# Patient Record
Sex: Male | Born: 1970 | Race: Black or African American | Hispanic: No | Smoking: Never smoker
Health system: Southern US, Community
[De-identification: ages and names within clinical notes are randomized; demographics above are authoritative.]

## PROBLEM LIST (undated history)

## (undated) DIAGNOSIS — N189 Chronic kidney disease, unspecified: Secondary | ICD-10-CM

## (undated) DIAGNOSIS — I509 Heart failure, unspecified: Secondary | ICD-10-CM

## (undated) DIAGNOSIS — I10 Essential (primary) hypertension: Secondary | ICD-10-CM

## (undated) DIAGNOSIS — E785 Hyperlipidemia, unspecified: Secondary | ICD-10-CM

## (undated) HISTORY — PX: AMPUTATION: SHX166

---

## 1998-09-29 HISTORY — PX: OTHER SURGICAL HISTORY: SHX169

## 2010-06-22 ENCOUNTER — Ambulatory Visit: Payer: Self-pay | Admitting: Cardiology

## 2010-06-22 ENCOUNTER — Inpatient Hospital Stay (HOSPITAL_COMMUNITY): Admission: EM | Admit: 2010-06-22 | Discharge: 2010-06-24 | Payer: Self-pay | Admitting: Emergency Medicine

## 2010-06-24 ENCOUNTER — Encounter (INDEPENDENT_AMBULATORY_CARE_PROVIDER_SITE_OTHER): Payer: Self-pay | Admitting: Internal Medicine

## 2010-12-12 LAB — CBC
HCT: 42.1 % (ref 39.0–52.0)
Hemoglobin: 12.6 g/dL — ABNORMAL LOW (ref 13.0–17.0)
MCH: 27.8 pg (ref 26.0–34.0)
MCHC: 32.8 g/dL (ref 30.0–36.0)
MCHC: 33.5 g/dL (ref 30.0–36.0)
MCV: 82.9 fL (ref 78.0–100.0)
MCV: 84.3 fL (ref 78.0–100.0)
Platelets: 302 10*3/uL (ref 150–400)
Platelets: 344 10*3/uL (ref 150–400)
RBC: 4.55 MIL/uL (ref 4.22–5.81)
RDW: 14.4 % (ref 11.5–15.5)
WBC: 8.2 10*3/uL (ref 4.0–10.5)

## 2010-12-12 LAB — HEPATITIS PANEL, ACUTE
Hep B C IgM: NEGATIVE
Hepatitis B Surface Ag: NEGATIVE

## 2010-12-12 LAB — COMPREHENSIVE METABOLIC PANEL
AST: 39 U/L — ABNORMAL HIGH (ref 0–37)
BUN: 21 mg/dL (ref 6–23)
CO2: 25 mEq/L (ref 19–32)
Calcium: 9 mg/dL (ref 8.4–10.5)
Chloride: 105 mEq/L (ref 96–112)
Creatinine, Ser: 1.17 mg/dL (ref 0.4–1.5)
GFR calc Af Amer: >60 mL/min (ref >60)
GFR calc non Af Amer: >60 mL/min (ref >60)
Total Bilirubin: 0.4 mg/dL (ref 0.3–1.2)

## 2010-12-12 LAB — DIFFERENTIAL
Basophils Absolute: 0 10*3/uL (ref 0.0–0.1)
Basophils Absolute: 0.1 10*3/uL (ref 0.0–0.1)
Basophils Relative: 1 % (ref 0–1)
Eosinophils Relative: 7 % — ABNORMAL HIGH (ref 0–5)
Lymphocytes Relative: 34 % (ref 12–46)
Lymphs Abs: 2.1 10*3/uL (ref 0.7–4.0)
Monocytes Absolute: 0.6 10*3/uL (ref 0.1–1.0)
Neutro Abs: 3.1 10*3/uL (ref 1.7–7.7)
Neutrophils Relative %: 51 % (ref 43–77)

## 2010-12-12 LAB — GLUCOSE, CAPILLARY
Glucose-Capillary: 126 mg/dL — ABNORMAL HIGH (ref 70–99)
Glucose-Capillary: 142 mg/dL — ABNORMAL HIGH (ref 70–99)
Glucose-Capillary: 179 mg/dL — ABNORMAL HIGH (ref 70–99)
Glucose-Capillary: 250 mg/dL — ABNORMAL HIGH (ref 70–99)
Glucose-Capillary: 250 mg/dL — ABNORMAL HIGH (ref 70–99)
Glucose-Capillary: 253 mg/dL — ABNORMAL HIGH (ref 70–99)
Glucose-Capillary: 299 mg/dL — ABNORMAL HIGH (ref 70–99)
Glucose-Capillary: 316 mg/dL — ABNORMAL HIGH (ref 70–99)
Glucose-Capillary: 327 mg/dL — ABNORMAL HIGH (ref 70–99)
Glucose-Capillary: 347 mg/dL — ABNORMAL HIGH (ref 70–99)
Glucose-Capillary: 351 mg/dL — ABNORMAL HIGH (ref 70–99)
Glucose-Capillary: >600 mg/dL (ref 70–99)

## 2010-12-12 LAB — BASIC METABOLIC PANEL
BUN: 20 mg/dL (ref 6–23)
BUN: 21 mg/dL (ref 6–23)
BUN: 22 mg/dL (ref 6–23)
BUN: 23 mg/dL (ref 6–23)
CO2: 21 mEq/L (ref 19–32)
CO2: 24 mEq/L (ref 19–32)
CO2: 25 mEq/L (ref 19–32)
CO2: 25 mEq/L (ref 19–32)
Calcium: 8.6 mg/dL (ref 8.4–10.5)
Calcium: 8.9 mg/dL (ref 8.4–10.5)
Calcium: 9 mg/dL (ref 8.4–10.5)
Chloride: 103 mEq/L (ref 96–112)
Chloride: 105 mEq/L (ref 96–112)
Creatinine, Ser: 1.11 mg/dL (ref 0.4–1.5)
Creatinine, Ser: 1.16 mg/dL (ref 0.4–1.5)
Creatinine, Ser: 1.25 mg/dL (ref 0.4–1.5)
Creatinine, Ser: 1.34 mg/dL (ref 0.4–1.5)
GFR calc Af Amer: >60 mL/min (ref >60)
GFR calc Af Amer: >60 mL/min (ref >60)
GFR calc Af Amer: >60 mL/min (ref >60)
GFR calc non Af Amer: 60 mL/min — ABNORMAL LOW (ref >60)
GFR calc non Af Amer: >60 mL/min (ref >60)
Glucose, Bld: 260 mg/dL — ABNORMAL HIGH (ref 70–99)
Glucose, Bld: 395 mg/dL — ABNORMAL HIGH (ref 70–99)
Potassium: 4.3 mEq/L (ref 3.5–5.1)
Potassium: 5.1 mEq/L (ref 3.5–5.1)
Sodium: 123 mEq/L — ABNORMAL LOW (ref 135–145)
Sodium: 130 mEq/L — ABNORMAL LOW (ref 135–145)
Sodium: 133 mEq/L — ABNORMAL LOW (ref 135–145)
Sodium: 135 mEq/L (ref 135–145)

## 2010-12-12 LAB — URINALYSIS, ROUTINE W REFLEX MICROSCOPIC
Bilirubin Urine: NEGATIVE
Ketones, ur: NEGATIVE mg/dL
Leukocytes, UA: NEGATIVE
Nitrite: NEGATIVE
Protein, ur: 30 mg/dL — AB
Urobilinogen, UA: 0.2 mg/dL (ref 0.0–1.0)

## 2010-12-12 LAB — MRSA PCR SCREENING: MRSA by PCR: NEGATIVE

## 2010-12-12 LAB — CARDIAC PANEL(CRET KIN+CKTOT+MB+TROPI)
CK, MB: 3.7 ng/mL (ref 0.3–4.0)
Relative Index: 2.4 (ref 0.0–2.5)
Relative Index: 2.6 — ABNORMAL HIGH (ref 0.0–2.5)
Total CK: 161 U/L (ref 7–232)
Troponin I: 0.57 ng/mL (ref 0.00–0.06)
Troponin I: 0.6 ng/mL (ref 0.00–0.06)

## 2010-12-12 LAB — LIPID PANEL
Cholesterol: 222 mg/dL — ABNORMAL HIGH (ref 0–200)
LDL Cholesterol: 88 mg/dL (ref 0–99)
Total CHOL/HDL Ratio: 3.1 RATIO
Triglycerides: 309 mg/dL — ABNORMAL HIGH (ref <150)

## 2010-12-12 LAB — TROPONIN I: Troponin I: 0.61 ng/mL (ref 0.00–0.06)

## 2010-12-12 LAB — HEMOGLOBIN A1C: Hgb A1c MFr Bld: 11.3 % — ABNORMAL HIGH (ref <5.7)

## 2011-09-02 ENCOUNTER — Inpatient Hospital Stay: Payer: Self-pay | Admitting: Internal Medicine

## 2011-09-03 DIAGNOSIS — R748 Abnormal levels of other serum enzymes: Secondary | ICD-10-CM

## 2011-09-03 DIAGNOSIS — R0602 Shortness of breath: Secondary | ICD-10-CM

## 2011-09-03 DIAGNOSIS — I319 Disease of pericardium, unspecified: Secondary | ICD-10-CM

## 2011-09-05 ENCOUNTER — Encounter: Payer: Self-pay | Admitting: Cardiovascular Disease

## 2012-03-03 ENCOUNTER — Inpatient Hospital Stay: Payer: Self-pay | Admitting: Internal Medicine

## 2012-03-03 LAB — CBC
HCT: 39 % — ABNORMAL LOW (ref 40.0–52.0)
MCHC: 31.9 g/dL — ABNORMAL LOW (ref 32.0–36.0)
Platelet: 347 10*3/uL (ref 150–440)
RBC: 4.67 10*6/uL (ref 4.40–5.90)
RDW: 15.3 % — ABNORMAL HIGH (ref 11.5–14.5)
WBC: 7.5 10*3/uL (ref 3.8–10.6)

## 2012-03-03 LAB — URINALYSIS, COMPLETE
Bacteria: NONE SEEN
Bilirubin,UR: NEGATIVE
Ketone: NEGATIVE
Nitrite: NEGATIVE
Protein: >=500
RBC,UR: <1 /HPF (ref 0–5)
Specific Gravity: 1.022 (ref 1.003–1.030)
Squamous Epithelial: NONE SEEN

## 2012-03-03 LAB — COMPREHENSIVE METABOLIC PANEL
Albumin: 1.6 g/dL — ABNORMAL LOW (ref 3.4–5.0)
Alkaline Phosphatase: 179 U/L — ABNORMAL HIGH (ref 50–136)
Anion Gap: 11 (ref 7–16)
BUN: 16 mg/dL (ref 7–18)
Chloride: 108 mmol/L — ABNORMAL HIGH (ref 98–107)
Co2: 19 mmol/L — ABNORMAL LOW (ref 21–32)
EGFR (African American): 43 — ABNORMAL LOW
EGFR (Non-African Amer.): 37 — ABNORMAL LOW
Glucose: 591 mg/dL (ref 65–99)
Osmolality: 304 (ref 275–301)
Potassium: 3.8 mmol/L (ref 3.5–5.1)
SGOT(AST): 27 U/L (ref 15–37)
SGPT (ALT): 32 U/L
Total Protein: 6.2 g/dL — ABNORMAL LOW (ref 6.4–8.2)

## 2012-03-04 LAB — CBC WITH DIFFERENTIAL/PLATELET
Basophil #: 0.1 10*3/uL (ref 0.0–0.1)
Basophil %: 0.7 %
Eosinophil %: 6.8 %
HGB: 10.2 g/dL — ABNORMAL LOW (ref 13.0–18.0)
MCH: 26.6 pg (ref 26.0–34.0)
MCHC: 32.8 g/dL (ref 32.0–36.0)
MCV: 81 fL (ref 80–100)
Monocyte %: 8.6 %
Neutrophil #: 4.8 10*3/uL (ref 1.4–6.5)
RBC: 3.84 10*6/uL — ABNORMAL LOW (ref 4.40–5.90)
RDW: 15.3 % — ABNORMAL HIGH (ref 11.5–14.5)

## 2012-03-04 LAB — BASIC METABOLIC PANEL
Anion Gap: 8 (ref 7–16)
Calcium, Total: 8.3 mg/dL — ABNORMAL LOW (ref 8.5–10.1)
Co2: 24 mmol/L (ref 21–32)
EGFR (African American): 40 — ABNORMAL LOW
EGFR (Non-African Amer.): 34 — ABNORMAL LOW
Glucose: 233 mg/dL — ABNORMAL HIGH (ref 65–99)
Osmolality: 289 (ref 275–301)
Potassium: 3.9 mmol/L (ref 3.5–5.1)

## 2012-03-04 LAB — LIPID PANEL
Cholesterol: 263 mg/dL — ABNORMAL HIGH (ref 0–200)
HDL Cholesterol: 72 mg/dL — ABNORMAL HIGH (ref 40–60)
VLDL Cholesterol, Calc: 50 mg/dL — ABNORMAL HIGH (ref 5–40)

## 2012-03-04 LAB — HEMOGLOBIN A1C: Hemoglobin A1C: 9.9 % — ABNORMAL HIGH (ref 4.2–6.3)

## 2012-03-09 LAB — CULTURE, BLOOD (SINGLE)

## 2012-04-29 ENCOUNTER — Emergency Department: Payer: Self-pay | Admitting: Emergency Medicine

## 2012-04-29 LAB — CBC
MCH: 26.7 pg (ref 26.0–34.0)
Platelet: 467 10*3/uL — ABNORMAL HIGH (ref 150–440)
WBC: 10.9 10*3/uL — ABNORMAL HIGH (ref 3.8–10.6)

## 2012-04-29 LAB — COMPREHENSIVE METABOLIC PANEL
Alkaline Phosphatase: 543 U/L — ABNORMAL HIGH (ref 50–136)
Anion Gap: 8 (ref 7–16)
BUN: 24 mg/dL — ABNORMAL HIGH (ref 7–18)
Chloride: 109 mmol/L — ABNORMAL HIGH (ref 98–107)
Co2: 23 mmol/L (ref 21–32)
Creatinine: 2.71 mg/dL — ABNORMAL HIGH (ref 0.60–1.30)
EGFR (African American): 33 — ABNORMAL LOW
Potassium: 4.4 mmol/L (ref 3.5–5.1)
SGOT(AST): 37 U/L (ref 15–37)
SGPT (ALT): 40 U/L
Sodium: 140 mmol/L (ref 136–145)
Total Protein: 7.6 g/dL (ref 6.4–8.2)

## 2012-05-01 ENCOUNTER — Emergency Department: Payer: Self-pay | Admitting: *Deleted

## 2012-05-01 LAB — URINALYSIS, COMPLETE
Bilirubin,UR: NEGATIVE
Blood: NEGATIVE
Glucose,UR: >=500 mg/dL (ref 0–75)
Ketone: NEGATIVE
Leukocyte Esterase: NEGATIVE
Nitrite: NEGATIVE
Ph: 6 (ref 4.5–8.0)
Squamous Epithelial: NONE SEEN
WBC UR: 2 /HPF (ref 0–5)

## 2012-05-01 LAB — COMPREHENSIVE METABOLIC PANEL
Albumin: 2.3 g/dL — ABNORMAL LOW (ref 3.4–5.0)
Anion Gap: 10 (ref 7–16)
BUN: 28 mg/dL — ABNORMAL HIGH (ref 7–18)
Bilirubin,Total: 0.6 mg/dL (ref 0.2–1.0)
Calcium, Total: 9.5 mg/dL (ref 8.5–10.1)
Creatinine: 2.94 mg/dL — ABNORMAL HIGH (ref 0.60–1.30)
Glucose: 544 mg/dL (ref 65–99)
Potassium: 5.2 mmol/L — ABNORMAL HIGH (ref 3.5–5.1)
SGOT(AST): 47 U/L — ABNORMAL HIGH (ref 15–37)
SGPT (ALT): 40 U/L (ref 12–78)
Total Protein: 8.1 g/dL (ref 6.4–8.2)

## 2012-05-01 LAB — CBC
HCT: 36 % — ABNORMAL LOW (ref 40.0–52.0)
HGB: 11.7 g/dL — ABNORMAL LOW (ref 13.0–18.0)
MCH: 26.7 pg (ref 26.0–34.0)
MCHC: 32.6 g/dL (ref 32.0–36.0)
MCV: 82 fL (ref 80–100)
Platelet: 505 10*3/uL — ABNORMAL HIGH (ref 150–440)
RBC: 4.4 10*6/uL (ref 4.40–5.90)

## 2012-05-02 ENCOUNTER — Emergency Department: Payer: Self-pay | Admitting: Emergency Medicine

## 2012-05-02 LAB — CBC
MCH: 26.4 pg (ref 26.0–34.0)
Platelet: 473 10*3/uL — ABNORMAL HIGH (ref 150–440)
RBC: 4.31 10*6/uL — ABNORMAL LOW (ref 4.40–5.90)
WBC: 11.7 10*3/uL — ABNORMAL HIGH (ref 3.8–10.6)

## 2012-05-02 LAB — COMPREHENSIVE METABOLIC PANEL
Anion Gap: 13 (ref 7–16)
Bilirubin,Total: 0.4 mg/dL (ref 0.2–1.0)
Chloride: 104 mmol/L (ref 98–107)
Co2: 20 mmol/L — ABNORMAL LOW (ref 21–32)
Creatinine: 2.47 mg/dL — ABNORMAL HIGH (ref 0.60–1.30)
EGFR (African American): 37 — ABNORMAL LOW
EGFR (Non-African Amer.): 32 — ABNORMAL LOW
Glucose: 425 mg/dL — ABNORMAL HIGH (ref 65–99)
Potassium: 4.4 mmol/L (ref 3.5–5.1)
SGOT(AST): 26 U/L (ref 15–37)
Total Protein: 7.6 g/dL (ref 6.4–8.2)

## 2012-05-04 LAB — CULTURE, BLOOD (SINGLE)

## 2012-05-06 ENCOUNTER — Ambulatory Visit: Payer: Self-pay

## 2012-05-06 ENCOUNTER — Emergency Department: Payer: Self-pay | Admitting: Emergency Medicine

## 2012-05-06 LAB — COMPREHENSIVE METABOLIC PANEL
Anion Gap: 7 (ref 7–16)
Calcium, Total: 8.6 mg/dL (ref 8.5–10.1)
Chloride: 107 mmol/L (ref 98–107)
Co2: 24 mmol/L (ref 21–32)
Osmolality: 291 (ref 275–301)
Potassium: 4.1 mmol/L (ref 3.5–5.1)
Sodium: 138 mmol/L (ref 136–145)
Total Protein: 7.2 g/dL (ref 6.4–8.2)

## 2012-05-06 LAB — CBC WITH DIFFERENTIAL/PLATELET
Basophil #: 0.1 10*3/uL (ref 0.0–0.1)
Eosinophil #: 0.3 10*3/uL (ref 0.0–0.7)
Eosinophil %: 2.7 %
HCT: 32.2 % — ABNORMAL LOW (ref 40.0–52.0)
HGB: 10.7 g/dL — ABNORMAL LOW (ref 13.0–18.0)
Lymphocyte #: 1.8 10*3/uL (ref 1.0–3.6)
Lymphocyte %: 14.4 %
MCH: 26.6 pg (ref 26.0–34.0)
MCV: 80 fL (ref 80–100)
Monocyte #: 0.9 x10 3/mm (ref 0.2–1.0)
Neutrophil #: 9.4 10*3/uL — ABNORMAL HIGH (ref 1.4–6.5)
Neutrophil %: 74.8 %
Platelet: 436 10*3/uL (ref 150–440)
RBC: 4.03 10*6/uL — ABNORMAL LOW (ref 4.40–5.90)
RDW: 13.5 % (ref 11.5–14.5)
WBC: 12.5 10*3/uL — ABNORMAL HIGH (ref 3.8–10.6)

## 2012-05-06 LAB — URINALYSIS, COMPLETE
Bilirubin,UR: NEGATIVE
Hyaline Cast: 22
Ketone: NEGATIVE
Leukocyte Esterase: NEGATIVE
Nitrite: NEGATIVE
Ph: 6 (ref 4.5–8.0)
Protein: >=500
Specific Gravity: 1.017 (ref 1.003–1.030)
WBC UR: 6 /HPF (ref 0–5)

## 2012-05-08 LAB — URINE CULTURE

## 2012-05-10 ENCOUNTER — Encounter (HOSPITAL_COMMUNITY): Payer: Self-pay | Admitting: Emergency Medicine

## 2012-05-10 ENCOUNTER — Inpatient Hospital Stay (HOSPITAL_COMMUNITY)
Admission: EM | Admit: 2012-05-10 | Discharge: 2012-05-12 | DRG: 684 | Disposition: A | Discharge status: Home / Self Care | Payer: Medicare Other | Attending: Family Medicine | Admitting: Family Medicine

## 2012-05-10 ENCOUNTER — Emergency Department (HOSPITAL_COMMUNITY): Payer: Medicare Other

## 2012-05-10 DIAGNOSIS — R739 Hyperglycemia, unspecified: Secondary | ICD-10-CM

## 2012-05-10 DIAGNOSIS — S98919A Complete traumatic amputation of unspecified foot, level unspecified, initial encounter: Secondary | ICD-10-CM

## 2012-05-10 DIAGNOSIS — E1149 Type 2 diabetes mellitus with other diabetic neurological complication: Secondary | ICD-10-CM | POA: Diagnosis present

## 2012-05-10 DIAGNOSIS — I129 Hypertensive chronic kidney disease with stage 1 through stage 4 chronic kidney disease, or unspecified chronic kidney disease: Secondary | ICD-10-CM | POA: Diagnosis present

## 2012-05-10 DIAGNOSIS — F1910 Other psychoactive substance abuse, uncomplicated: Secondary | ICD-10-CM | POA: Diagnosis present

## 2012-05-10 DIAGNOSIS — E785 Hyperlipidemia, unspecified: Secondary | ICD-10-CM | POA: Diagnosis present

## 2012-05-10 DIAGNOSIS — R1110 Vomiting, unspecified: Secondary | ICD-10-CM

## 2012-05-10 DIAGNOSIS — R748 Abnormal levels of other serum enzymes: Secondary | ICD-10-CM

## 2012-05-10 DIAGNOSIS — Z794 Long term (current) use of insulin: Secondary | ICD-10-CM

## 2012-05-10 DIAGNOSIS — E1142 Type 2 diabetes mellitus with diabetic polyneuropathy: Secondary | ICD-10-CM | POA: Diagnosis present

## 2012-05-10 DIAGNOSIS — L97509 Non-pressure chronic ulcer of other part of unspecified foot with unspecified severity: Secondary | ICD-10-CM | POA: Diagnosis present

## 2012-05-10 DIAGNOSIS — Z79899 Other long term (current) drug therapy: Secondary | ICD-10-CM

## 2012-05-10 DIAGNOSIS — Z765 Malingerer [conscious simulation]: Secondary | ICD-10-CM

## 2012-05-10 DIAGNOSIS — N419 Inflammatory disease of prostate, unspecified: Secondary | ICD-10-CM | POA: Diagnosis present

## 2012-05-10 DIAGNOSIS — N189 Chronic kidney disease, unspecified: Secondary | ICD-10-CM | POA: Diagnosis present

## 2012-05-10 DIAGNOSIS — N179 Acute kidney failure, unspecified: Principal | ICD-10-CM | POA: Diagnosis present

## 2012-05-10 HISTORY — DX: Hyperlipidemia, unspecified: E78.5

## 2012-05-10 HISTORY — DX: Chronic kidney disease, unspecified: N18.9

## 2012-05-10 HISTORY — DX: Essential (primary) hypertension: I10

## 2012-05-10 LAB — GLUCOSE, CAPILLARY: Glucose-Capillary: 374 mg/dL — ABNORMAL HIGH (ref 70–99)

## 2012-05-10 LAB — COMPREHENSIVE METABOLIC PANEL
AST: 20 U/L (ref 0–37)
Albumin: 2.3 g/dL — ABNORMAL LOW (ref 3.5–5.2)
Calcium: 8.9 mg/dL (ref 8.4–10.5)
Creatinine, Ser: 2.79 mg/dL — ABNORMAL HIGH (ref 0.50–1.35)
Sodium: 133 mEq/L — ABNORMAL LOW (ref 135–145)

## 2012-05-10 LAB — CBC WITH DIFFERENTIAL/PLATELET
Basophils Absolute: 0 10*3/uL (ref 0.0–0.1)
Basophils Relative: 0 % (ref 0–1)
Eosinophils Relative: 5 % (ref 0–5)
HCT: 33.3 % — ABNORMAL LOW (ref 39.0–52.0)
MCHC: 33.9 g/dL (ref 30.0–36.0)
MCV: 78.2 fL (ref 78.0–100.0)
Monocytes Absolute: 0.6 10*3/uL (ref 0.1–1.0)
Neutro Abs: 5.7 10*3/uL (ref 1.7–7.7)
Platelets: 424 10*3/uL — ABNORMAL HIGH (ref 150–400)
RDW: 13.2 % (ref 11.5–15.5)

## 2012-05-10 NOTE — ED Notes (Signed)
Pt reports R sided infection at site where had all toes amputated in 2012--reports it has never healed since the surgery; also reports he has a prostate infection d/t a having a digital exam about a week ago; reports has had fevers and n/v

## 2012-05-10 NOTE — ED Notes (Signed)
The pt reports he has a prostate infection and an infection in his rt foot where his toes were amputated in 2011 or 2012.  He has a doctor in [place] where he has been getting his treatment.  He came here tonight because he was in town.  No  Difference in the pain  Than usual

## 2012-05-10 NOTE — ED Provider Notes (Signed)
History     CSN: [id number]  Arrival date & time 05/10/12  1928   First MD Initiated Contact with Patient 05/10/12 2238      Chief Complaint  Patient presents with  . Wound Infection    (Consider location/radiation/quality/duration/timing/severity/associated sxs/prior treatment) HPI Comments: Patient with a history of hypertension, diabetes, and bilateral transmetatarsal amputations presents to the emergency department because of impaired wound healing.  Amputation occurred back in 2012 and patient is currently being followed by regional [facility] doctors, however he states that his right amputation has never fully healed and he is concerned that there is an infection.  Patient was last seen by his physician approximately 3 months ago, however he was recently evaluated in by regional [facility] emergency department and placed on Cipro x2 weeks twice a day.  Patient states he is completed a full course of antibiotics, but the wound still has intermittent purulent drainage and pain.  Patient is currently waiting for availability to be seen at the wound clinic in [facility] which is going to take approximately one month.  He denies any fever, night sweats, chills, erythema, warmth at the site, diaphoresis, difficulty breathing or chest pain.  IN addition pt reports hyper emesis x 2 days (~2 episodes daily) and constipation x 5 days. He has no hx of abdominal surgery and is having flatulence. Denies abdominal pain, hematochezia, melena, & hematemesis.  Patient has no other complaints at this time.  The history is provided by the patient.    Past Medical History  Diagnosis Date  . Hypertension   . Diabetes mellitus   . Hyperlipidemia     Past Surgical History  Procedure Date  . Amputation     No family history on file.  History  Substance Use Topics  . Smoking status: Never Smoker   . Smokeless tobacco: Not on file  . Alcohol Use: No      Review of Systems  Constitutional:  Negative for fever, diaphoresis and activity change.  HENT: Negative for congestion and neck pain.   Respiratory: Negative for cough.   Genitourinary: Negative for dysuria.  Musculoskeletal: Negative for myalgias.  Skin: Positive for wound. Negative for color change, pallor and rash.  Neurological: Negative for headaches.  All other systems reviewed and are negative.    Allergies  Morphine and related and Sulfa antibiotics  Home Medications   Current Outpatient Rx  Name Route Sig Dispense Refill  . AMLODIPINE BESYLATE 10 MG PO TABS Oral Take 10 mg by mouth daily.    Marland Kitchen CLONIDINE HCL 0.2 MG PO TABS Oral Take 0.2 mg by mouth 2 (two) times daily.    . FUROSEMIDE 20 MG PO TABS Oral Take 20 mg by mouth 2 (two) times daily.    Marland Kitchen HYDRALAZINE HCL 100 MG PO TABS Oral Take 100 mg by mouth 2 (two) times daily.    . INSULIN ASPART 100 UNIT/ML [place] SOLN Subcutaneous Inject into the skin 3 (three) times daily before meals. Sliding scale    . INSULIN GLARGINE 100 UNIT/ML [place] SOLN Subcutaneous Inject 40 Units into the skin 2 (two) times daily.    Marland Kitchen LABETALOL HCL 300 MG PO TABS Oral Take 300 mg by mouth 2 (two) times daily.    Marland Kitchen SIMVASTATIN 40 MG PO TABS Oral Take 40 mg by mouth every evening.      BP 182/87  Pulse 82  Temp 99 F (37.2 C) (Oral)  Resp 19  SpO2 95%  Physical Exam  Constitutional: He is oriented to person, place, and time. He appears well-developed and well-nourished. No distress.  HENT:  Head: Normocephalic and atraumatic.  Mouth/Throat: Oropharynx is clear and moist. No oropharyngeal exudate.  Eyes: Conjunctivae and EOM are normal. Pupils are equal, round, and reactive to light. No scleral icterus.  Neck: Normal range of motion. Neck supple. No tracheal deviation present. No thyromegaly present.  Cardiovascular: Normal rate, regular rhythm, normal heart sounds and intact distal pulses.   Pulmonary/Chest: Effort normal and breath sounds normal. No stridor. No respiratory  distress. He has no wheezes.  Abdominal: Soft.  Musculoskeletal: Normal range of motion. He exhibits tenderness. He exhibits no edema.       Tenderness to palpation at right distal extremity he at amputation site.  Neurological: He is alert and oriented to person, place, and time. Coordination normal.  Skin: Skin is warm and dry. No rash noted. He is not diaphoretic. No erythema. No pallor.       2 cm superficial wound located on the distal surface of right extremity at amputation site.  No palpable tracts.  No pertinent draining or bleeding.  No warmth, erythema, or swelling.  Psychiatric: He has a normal mood and affect. His behavior is normal.    ED Course  Procedures (including critical care time)  Labs Reviewed  CBC WITH DIFFERENTIAL - Abnormal; Notable for the following:    Hemoglobin 11.3 (*)     HCT 33.3 (*)     Platelets 424 (*)     All other components within normal limits  COMPREHENSIVE METABOLIC PANEL - Abnormal; Notable for the following:    Sodium 133 (*)     Glucose, Bld 449 (*)     Creatinine, Ser 2.79 (*)     Albumin 2.3 (*)     Alkaline Phosphatase 388 (*)     Total Bilirubin 0.1 (*)     GFR calc non Af Amer 27 (*)     GFR calc Af Amer 31 (*)     All other components within normal limits  GLUCOSE, CAPILLARY - Abnormal; Notable for the following:    Glucose-Capillary 414 (*)     All other components within normal limits  GLUCOSE, CAPILLARY - Abnormal; Notable for the following:    Glucose-Capillary 374 (*)     All other components within normal limits  URINALYSIS, ROUTINE W REFLEX MICROSCOPIC   Dg Foot Complete Right  05/10/2012  *RADIOLOGY REPORT*  Clinical Data: Pain and infection.  RIGHT FOOT COMPLETE - 3+ VIEW  Comparison: None.  Findings: There has been previous transmetatarsal amputation.  No subcutaneous gas.  Arterial calcifications are evident.  Small calcaneal spur.  IMPRESSION:  1.  Previous transmetatarsal amputation without evident acute  abnormality.  Original Report Authenticated By: Osa Craver, M.D.     No diagnosis found.    MDM  Hyperglycemia, Emesis, Acute renal failure  Patient presents emergency department complaining of hyperemesis and impaired wound healing at amputation site.  He is followed at [facility] and did not have a physician in town.  Labs and imaging reviewed findings hypoglycemia, acute renal failure and elevated alkaline phosphatase.  Patient is to be admitted to family medicine (unassigned) for further evaluation. The patient appears reasonably stabilized for admission considering the current resources, flow, and capabilities available in the ED at this time, and I doubt any other [facility] requiring further screening and/or treatment in the ED prior to admission.  Jaci Carrel, [place] 05/11/12 [phone]

## 2012-05-10 NOTE — ED Notes (Signed)
Pt to xray

## 2012-05-11 ENCOUNTER — Encounter (HOSPITAL_COMMUNITY): Payer: Self-pay | Admitting: Family Medicine

## 2012-05-11 ENCOUNTER — Emergency Department (HOSPITAL_COMMUNITY): Payer: Medicare Other

## 2012-05-11 DIAGNOSIS — N390 Urinary tract infection, site not specified: Secondary | ICD-10-CM

## 2012-05-11 DIAGNOSIS — N179 Acute kidney failure, unspecified: Secondary | ICD-10-CM

## 2012-05-11 LAB — URINALYSIS, ROUTINE W REFLEX MICROSCOPIC
Glucose, UA: >1000 mg/dL — AB
Specific Gravity, Urine: 1.021 (ref 1.005–1.030)
Urobilinogen, UA: 0.2 mg/dL (ref 0.0–1.0)
pH: 6.5 (ref 5.0–8.0)

## 2012-05-11 LAB — GLUCOSE, CAPILLARY
Glucose-Capillary: 398 mg/dL — ABNORMAL HIGH (ref 70–99)
Glucose-Capillary: 291 mg/dL — ABNORMAL HIGH (ref 70–99)
Glucose-Capillary: 283 mg/dL — ABNORMAL HIGH (ref 70–99)
Glucose-Capillary: 293 mg/dL — ABNORMAL HIGH (ref 70–99)
Glucose-Capillary: 272 mg/dL — ABNORMAL HIGH (ref 70–99)

## 2012-05-11 LAB — URINE MICROSCOPIC-ADD ON

## 2012-05-11 LAB — RENAL FUNCTION PANEL
BUN: 19 mg/dL (ref 6–23)
Chloride: 106 mEq/L (ref 96–112)
Glucose, Bld: 328 mg/dL — ABNORMAL HIGH (ref 70–99)
Potassium: 3.8 mEq/L (ref 3.5–5.1)

## 2012-05-11 LAB — DRUGS OF ABUSE SCREEN W/O ALC, ROUTINE URINE
Amphetamine Screen, Ur: NEGATIVE
Barbiturate Quant, Ur: NEGATIVE
Benzodiazepines.: NEGATIVE
Creatinine,U: 63.1 mg/dL
Marijuana Metabolite: NEGATIVE
Methadone: NEGATIVE

## 2012-05-11 LAB — CBC
HCT: 32.8 % — ABNORMAL LOW (ref 39.0–52.0)
Hemoglobin: 11.1 g/dL — ABNORMAL LOW (ref 13.0–17.0)
WBC: 10.8 10*3/uL — ABNORMAL HIGH (ref 4.0–10.5)

## 2012-05-11 LAB — LIPASE, BLOOD: Lipase: 45 U/L (ref 11–59)

## 2012-05-11 LAB — GAMMA GT: GGT: 1188 U/L — ABNORMAL HIGH (ref 7–51)

## 2012-05-11 MED ORDER — POLYETHYLENE GLYCOL 3350 17 G PO PACK
17.0000 g | PACK | Freq: Two times a day (BID) | ORAL | Status: DC
  Administered 2012-05-11 – 2012-05-12 (×3): 17 g via ORAL
  Filled 2012-05-11 (×4): qty 1

## 2012-05-11 MED ORDER — ACETAMINOPHEN 325 MG PO TABS: 650.0000 mg | ORAL_TABLET | Freq: Four times a day (QID) | ORAL | Status: DC | PRN

## 2012-05-11 MED ORDER — HEPARIN SODIUM (PORCINE) 5000 UNIT/ML IJ SOLN
5000.0000 [IU] | Freq: Three times a day (TID) | INTRAMUSCULAR | Status: DC
  Administered 2012-05-11 – 2012-05-12 (×5): 5000 [IU] via SUBCUTANEOUS
  Filled 2012-05-11 (×7): qty 1

## 2012-05-11 MED ORDER — CLONIDINE HCL 0.2 MG PO TABS
0.2000 mg | ORAL_TABLET | Freq: Two times a day (BID) | ORAL | Status: DC
  Administered 2012-05-11 – 2012-05-12 (×3): 0.2 mg via ORAL
  Filled 2012-05-11 (×5): qty 1

## 2012-05-11 MED ORDER — HYDRALAZINE HCL 50 MG PO TABS
100.0000 mg | ORAL_TABLET | Freq: Two times a day (BID) | ORAL | Status: DC
  Administered 2012-05-11 – 2012-05-12 (×3): 100 mg via ORAL
  Filled 2012-05-11 (×4): qty 2

## 2012-05-11 MED ORDER — ONDANSETRON HCL 4 MG/2ML IJ SOLN: 4.0000 mg | Freq: Four times a day (QID) | INTRAMUSCULAR | Status: DC | PRN

## 2012-05-11 MED ORDER — SODIUM CHLORIDE 0.9 % IV SOLN
INTRAVENOUS | Status: DC
  Administered 2012-05-11 – 2012-05-12 (×4): via INTRAVENOUS

## 2012-05-11 MED ORDER — INSULIN ASPART 100 UNIT/ML ~~LOC~~ SOLN
0.0000 [IU] | Freq: Three times a day (TID) | SUBCUTANEOUS | Status: DC
  Administered 2012-05-11: 5 [IU] via SUBCUTANEOUS
  Administered 2012-05-11: 8 [IU] via SUBCUTANEOUS
  Administered 2012-05-11: 15 [IU] via SUBCUTANEOUS
  Administered 2012-05-12: 11 [IU] via SUBCUTANEOUS
  Administered 2012-05-12: 15 [IU] via SUBCUTANEOUS

## 2012-05-11 MED ORDER — LABETALOL HCL 300 MG PO TABS
300.0000 mg | ORAL_TABLET | Freq: Two times a day (BID) | ORAL | Status: DC
  Administered 2012-05-11 – 2012-05-12 (×3): 300 mg via ORAL
  Filled 2012-05-11 (×4): qty 1

## 2012-05-11 MED ORDER — HYDROMORPHONE HCL PF 1 MG/ML IJ SOLN
0.5000 mg | INTRAMUSCULAR | Status: DC | PRN
  Administered 2012-05-11: 0.5 mg via INTRAVENOUS
  Filled 2012-05-11: qty 1

## 2012-05-11 MED ORDER — CIPROFLOXACIN HCL 500 MG PO TABS
500.0000 mg | ORAL_TABLET | Freq: Two times a day (BID) | ORAL | Status: DC
  Administered 2012-05-11 – 2012-05-12 (×3): 500 mg via ORAL
  Filled 2012-05-11 (×5): qty 1

## 2012-05-11 MED ORDER — SODIUM CHLORIDE 0.9 % IV BOLUS (SEPSIS)
1000.0000 mL | Freq: Once | INTRAVENOUS | Status: AC
  Administered 2012-05-11: 1000 mL via INTRAVENOUS

## 2012-05-11 MED ORDER — HYDROCODONE-ACETAMINOPHEN 5-325 MG PO TABS
1.0000 | ORAL_TABLET | ORAL | Status: DC | PRN
  Administered 2012-05-11 – 2012-05-12 (×4): 2 via ORAL
  Filled 2012-05-11 (×4): qty 2

## 2012-05-11 MED ORDER — ONDANSETRON HCL 4 MG PO TABS: 4.0000 mg | ORAL_TABLET | Freq: Four times a day (QID) | ORAL | Status: DC | PRN

## 2012-05-11 MED ORDER — INSULIN ASPART 100 UNIT/ML ~~LOC~~ SOLN
10.0000 [IU] | Freq: Once | SUBCUTANEOUS | Status: AC
  Administered 2012-05-11: 10 [IU] via SUBCUTANEOUS

## 2012-05-11 MED ORDER — SENNA 8.6 MG PO TABS
2.0000 | ORAL_TABLET | Freq: Two times a day (BID) | ORAL | Status: DC
  Administered 2012-05-11 – 2012-05-12 (×3): 17.2 mg via ORAL
  Filled 2012-05-11 (×4): qty 2

## 2012-05-11 MED ORDER — SIMVASTATIN 40 MG PO TABS
40.0000 mg | ORAL_TABLET | Freq: Every evening | ORAL | Status: DC
  Administered 2012-05-11 – 2012-05-12 (×2): 40 mg via ORAL
  Filled 2012-05-11 (×2): qty 1

## 2012-05-11 MED ORDER — INSULIN GLARGINE 100 UNIT/ML ~~LOC~~ SOLN
40.0000 [IU] | Freq: Two times a day (BID) | SUBCUTANEOUS | Status: DC
  Administered 2012-05-11 – 2012-05-12 (×3): 40 [IU] via SUBCUTANEOUS

## 2012-05-11 MED ORDER — AMLODIPINE BESYLATE 10 MG PO TABS
10.0000 mg | ORAL_TABLET | Freq: Every day | ORAL | Status: DC
  Administered 2012-05-11 – 2012-05-12 (×2): 10 mg via ORAL
  Filled 2012-05-11 (×2): qty 1

## 2012-05-11 NOTE — ED Notes (Signed)
 of nss added to iv to run 163ml/hr

## 2012-05-11 NOTE — Progress Notes (Signed)
Physical Therapy Evaluation Patient Details Name: Troy Buchanan MRN: [id number] DOB: 08-May-1971 Today's Date: 05/11/2012 Time: 1440-1500 PT Time Calculation (min): 20 min  PT Assessment / Plan / Recommendation Clinical Impression  41 yo male admitted with acute renal failure, also with prostatitis, and h/o bilateral transmetatarsal amputations presents to PT as independent with all mobility;   No further PT needs noted;   Will sign off;   OK to ambulate hallways independently    PT Assessment  Patent does not need any further PT services    Follow Up Recommendations  No PT follow up    Barriers to Discharge        Equipment Recommendations  None recommended by PT    Recommendations for Other Services     Frequency      Precautions / Restrictions Precautions Precautions: None   Pertinent Vitals/Pain Reports aching "everywhere" -- also stated felt better with walking      Mobility  Bed Mobility Bed Mobility: Supine to Sit Supine to Sit: 7: Independent Transfers Transfers: Sit to Stand;Stand to Sit Sit to Stand: 7: Independent Stand to Sit: 7: Independent Ambulation/Gait Ambulation/Gait Assistance: 5: Supervision;6: Modified independent (Device/Increase time) Ambulation Distance (Feet): 300 Feet Assistive device: None (and pushing IV pole) Ambulation/Gait Assistance Details: Supervision progressing to modified independent; Wide step width; no loss of balance noted Gait Pattern: Wide base of support Gait velocity: WNL General Gait Details: Managed IV pole well Stairs: No Wheelchair Mobility Wheelchair Mobility: No    Exercises     PT Diagnosis:    PT Problem List:   PT Treatment Interventions:     PT Goals    Visit Information  Last PT Received On: 05/11/12 Assistance Needed: +1    Subjective Data  Subjective: still hurting, but feeling better, and likes walking to "stretch my legs" Patient Stated Goal: less pain   Prior Functioning  Home  Living Lives With: Family (sister) Available Help at Discharge: Available PRN/intermittently Type of Home: House Home Access: Stairs to enter Secretary/administrator of Steps: 1 Entrance Stairs-Rails: None Home Layout: One level Home Adaptive Equipment: Crutches (doesn't use) Prior Function Level of Independence: Independent Able to Take Stairs?: Yes Driving: Yes Vocation: On disability Communication Communication: No difficulties    Cognition  Overall Cognitive Status: Appears within functional limits for tasks assessed/performed Arousal/Alertness: Awake/alert Orientation Level: Appears intact for tasks assessed Behavior During Session: [facility] for tasks performed    Extremity/Trunk Assessment Right Upper Extremity Assessment RUE ROM/Strength/Tone: Within functional levels Left Upper Extremity Assessment LUE ROM/Strength/Tone: Within functional levels Right Lower Extremity Assessment RLE ROM/Strength/Tone: WFL for tasks assessed (Transmet amputation) Left Lower Extremity Assessment LLE ROM/Strength/Tone: WFL for tasks assessed (Transmet amputation) Trunk Assessment Trunk Assessment: Normal   Balance    End of Session PT - End of Session Activity Tolerance: Patient tolerated treatment well Patient left: in bed;with call bell/phone within reach Nurse Communication: Mobility status  GP     Van Clines [facility] [place], [place] [phone]  05/11/2012, 3:57 PM

## 2012-05-11 NOTE — ED Notes (Signed)
Report received from [place] C. Pt. Sleeping. Respirations even and regular. NAD.

## 2012-05-11 NOTE — Progress Notes (Signed)
Spoke with patient regarding his plans for follow up after he is discharged. He plans to continue to see his primary care doctor, Karen Kitchens of [facility] in [facility]. Says he sees this PCP about every other month. I called and spoke with Dr. Georgena Spurling, who states that patient is generally noncompliant with his care and does not come in every other month. Has been seen in the ED around 10 times. PCP also believes that this is most likely acute on chronic renal failure. Last creatinine was 1.9 in April  Dr. Georgena Spurling has sent pt to the ED in the past for elevated alkaline phosphatase levels up to 730, at which time he also had an ALT of 308 and an AST of 117. He does NOT feel that pt generally displays drug seeking behaviors. Pt did have a prescription for oxycodone-acetaminophen that expired July 19.  I asked patient if he feels like he could go home today, to which he says that he does not think so. Says he is hurting in his rectum, his foot, and his kidneys. When I asked where he is hurting the most, he says "everywhere". States that if he were to go home he would end up coming right back. I explained to him that we were going to at least be switching him to oral pain medications, which he agreed to.  -------  [facility] at [phone] to inquire whether pt has been seen there recently as he is unable to name the urology clinic but knows it is in [place]. He was seen there on the 8th and saw the PA, [facility]. Has a follow up appointment scheduled for August 21. The person I spoke with on the phone was unable to relay any medical information to me, but said she could send records if we sent a faxed release. Will initiate this process. In the meantime, I have given her the FPTS pager # and she will have someone get in touch with [place] this evening.  -------  Pt reports he does not regularly see an orthopedic doctor, but that he had his amputation  done by an orthopedist at [facility]. I called them ([phone]) but they have no records of him being seen there.

## 2012-05-11 NOTE — Progress Notes (Signed)
Pt transferred from the ED around 0500, admitted to Rm 6741. Pt comes from home. He is alert and orientedx4. Ambulatory with standby assist. All toes on both feet are amputated, small wound to top of Right stump (2x2x0.1). Oriented to room, instructed to call for assistance before getting out of bed. Resting comfortably at this time, will continue to monitor

## 2012-05-11 NOTE — ED Notes (Signed)
Admitting doctors here to see.  The pt has  Had of nss infused.  His blood sugar has already dropped without any insulin

## 2012-05-11 NOTE — Progress Notes (Signed)
   CARE MANAGEMENT NOTE 05/11/2012  Patient:  Troy Buchanan,Troy Buchanan   Account Number:  [id number]  Date Initiated:  05/11/2012  Documentation initiated by:  Darlyne Russian  Subjective/Objective Assessment:   Patient admitted with right foot infection     Action/Plan:   Progression of care and discharge planning   Anticipated DC Date:     Anticipated DC Plan:  HOME/SELF CARE      DC Planning Services  CM consult      Choice offered to / List presented to:             Status of service:  In process, will continue to follow Medicare Important Message given?   (If response is "NO", the following Medicare IM given date fields will be blank) Date Medicare IM given:   Date Additional Medicare IM given:    Discharge Disposition:    Per UR Regulation:    If discussed at Long Length of Stay Meetings, dates discussed:    Comments:  05/11/2012  1600 Darlyne Russian RN, [place]  [phone] Met with patient to dscuss discharge planning. He lives with his sister and has medical follow up with [facility] and medications from [facility] on [address].

## 2012-05-11 NOTE — ED Notes (Signed)
Iv started nss wo.  Pt sleeping intermittently

## 2012-05-11 NOTE — ED Provider Notes (Signed)
Medical screening examination/treatment/procedure(s) were performed by non-physician practitioner and as supervising physician I was immediately available for consultation/collaboration.  Nicole Hafley Lytle Michaels, MD 05/11/12 [phone]

## 2012-05-11 NOTE — ED Notes (Signed)
C/o pain in his rt foot.  Pain med given

## 2012-05-11 NOTE — H&P (Signed)
Family Medicine Teaching Service Attending Note  I interviewed and examined patient Troy Buchanan and reviewed their tests and x-rays.  I discussed with Dr. Louanne Belton and reviewed their note for today.  I agree with their assessment and plan.     Additionally  Unfortunate man who seems to have had fragmented care and followup Agree with restarting antibiotics for probable prostatis His foot does not appear acutely infected - no discharge or erythema Would treat foot pain with gabapention - narcotics are not indicated Contact his urologist and PCP - if he wishes to continue with them he should follow up closely.   If he wishes to switch his care to our system will need to establish new PCP and urologist Depression might be a large part of his apparent fragmented care.  Offer antidepressant and urge close follow up

## 2012-05-11 NOTE — H&P (Signed)
Family Medicine Teaching [facility] Admission History and Physical Service Pager: [phone]  Patient name: Troy Buchanan Medical record number: [id number] Date of birth: 1971-08-24 Age: 41 y.o. Gender: male  Primary Care Provider: Dr. Patria Mane, [facility]  Chief Complaint: prostate infection, foot infection  Assessment and Plan: Troy Buchanan is a 41 y.o. year old male presenting with complaints of a right foot infection and prostate infection, found to have acute renal failure. 1. Acute renal failure: Baseline creatinine appears to be around 1.10, and is 2.79 today. Patient has reportedly had foley catheter in place for two weeks, so I doubt the cause of the renal failure is obstructive or post-renal. Pt has been vomiting for the last two days, so could likely be pre-renal. Will hydrate pt with NS @ 100cc/hr. Will obtain urine urea nitrogen to calculate fractional excretion of urea (cannot do FENa due to home lasix). Consider consulting renal team if creatinine does not improve with fluids. Check renal panel in AM. 2. Prostatitis: Per patient, was on cipro for 10 days for a prostate infection. Reports he has had foley catheter in place for ~2 weeks for difficulty urinating. Will attempt to obtain records from treating urologist. UA shows no signs of UTI. Prostate is tender on rectal exam. 3. Foot ulcer: Does not appear to be infected at this time. Will continue to monitor and consider getting wound care consult. If worsens, consider MRI foot to rule out osteomyelitis. 4. Elevated alkaline phosphatase: 388 today, was 152 in past. Possibly chronic. Continue to follow. 5. Vomiting: hydrate with normal saline. Zofran prn for nausea. 6. Possible drug seeking behavior: Patient has been seen at multiple sites in multiple hospital systems, and asks for specific pain medication (starts with "d"), has morphine intolerance documented. Will check urine drug screen. Will give Norco and Dilaudid for  pain in the meantime. 7. Hypertension: Continue home medications (except Lasix in setting of acute renal failure). 8. Diabetes: Continue home Lantus 40 units BID, plus moderate sliding scale insulin. Will check HgbA1c. 9. Hyperlipidemia: continue home statin. 10. FEN/GI: NS @ 100cc/hr, renal diet 11. Prophylaxis: heparin 12. Disposition: pending clinical improvement 13. Code Status: full code  History of Present Illness: Troy Buchanan is a 41 y.o. year old male presenting with complaints of right foot infection and a prostate infection.  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 Last hospitalization was a few months ago. Recently was on a course of cipro about one month ago. Does not see any wound care doctors (can't get in until next month). Has not seen vascular surgery. Surgery was done at [facility]. Reports some pus draining from the ulcer a few days ago. Says he thinks the ulcer is getting deeper.  Urinary problems: Two weeks ago, woke up and could not urinate. Felt the need to urinate but could not, only had a little dribble coming out. This has been that way ever since. No history of urinary problems or urologic surgeries. Has had pain in the suprapubic area. No foul smelling urine or change in urine appearance. Has had constipation since the urinary problems started.  Per patient, went to [facility] ED two weeks ago when this began and had a foley catheter placed. He followed up with urology, who gave him a 10 day course of cipro. Pt reports he took  all 10 days worth. Has f/u appointment with urology on August 22. Pt reports good urine output through the foley. Of note, he has a history of being on dialysis for about three months due to "fluid buildup" in 2009.  Patient asks for pain medication, reports that in the past he has  been given something that starts with "D".  ROS: positive for vomiting (threw up 6 times yesterday, also vomited today). No hematemesis. No shortness of breath, cough, rashes, fevers, or chills.   Past Medical History: Past Medical History  Diagnosis Date  . Hypertension   . Diabetes mellitus   . Hyperlipidemia    Past Surgical History: Past Surgical History  Procedure Date  . Amputation Left 2009, Right 2012  . Buttock abscess 2000   Social History: History  Substance Use Topics  . Smoking status: Never Smoker   . Smokeless tobacco: Not on file  . Alcohol Use: No   For any additional social history documentation, please refer to relevant sections of EMR.  Family History: No family history on file. Allergies: Allergies  Allergen Reactions  . Morphine And Related Nausea Only  . Sulfa Antibiotics Other (See Comments)    swelling   No current facility-administered medications on file prior to encounter.   Current Outpatient Prescriptions on File Prior to Encounter  Medication Sig Dispense Refill  . amLODipine (NORVASC) 10 MG tablet Take 10 mg by mouth daily.      . cloNIDine (CATAPRES) 0.2 MG tablet Take 0.2 mg by mouth 2 (two) times daily.      . furosemide (LASIX) 20 MG tablet Take 20 mg by mouth 2 (two) times daily.      . hydrALAZINE (APRESOLINE) 100 MG tablet Take 100 mg by mouth 2 (two) times daily.      . insulin aspart (NOVOLOG) 100 UNIT/ML injection Inject into the skin 3 (three) times daily before meals. Sliding scale      . insulin glargine (LANTUS) 100 UNIT/ML injection Inject 40 Units into the skin 2 (two) times daily.      Marland Kitchen labetalol (NORMODYNE) 300 MG tablet Take 300 mg by mouth 2 (two) times daily.      . simvastatin (ZOCOR) 40 MG tablet Take 40 mg by mouth every evening.       Physical Exam: BP 189/88  Pulse 84  Temp 99 F (37.2 C) (Oral)  Resp 20  SpO2 96% Exam: General: NAD, lying in bed Cardiovascular: RRR, no murmurs, rubs, or  gallops Respiratory: CTAB via lateral auscultation, normal respiratory effort Abdomen: moderately distended, + BS, tender to palpation in the suprapubic area Extremities: bilateral amputations beyond the metatarsal. 1cm ulcerated area at most distal part of right foot. No drainage or erythema. Rectal: (As performed by Dr. Louanne Belton) Prostate tender. No gross blood. Skin: No visible rashes. Foot ulcer as above. Neuro: No focal deficits.  Labs and Imaging:  CBC:    Component Value Date/Time   WBC 8.1 05/10/2012 1956   HGB 11.3* 05/10/2012 1956   HCT 33.3* 05/10/2012 1956   PLT 424* 05/10/2012 1956   MCV 78.2 05/10/2012 1956   NEUTROABS 5.7 05/10/2012 1956   LYMPHSABS 1.4 05/10/2012 1956   MONOABS 0.6 05/10/2012 1956   EOSABS 0.4 05/10/2012 1956   BASOSABS 0.0 05/10/2012 1956    Comprehensive Metabolic Panel:    Component Value Date/Time   NA 133* 05/10/2012 1956   K 4.1 05/10/2012 1956   CL 103 05/10/2012 1956   CO2 21 05/10/2012 1956  BUN 22 05/10/2012 1956   CREATININE 2.79* 05/10/2012 1956   GLUCOSE 449* 05/10/2012 1956   CALCIUM 8.9 05/10/2012 1956   AST 20 05/10/2012 1956   ALT 19 05/10/2012 1956   ALKPHOS 388* 05/10/2012 1956   BILITOT 0.1* 05/10/2012 1956   PROT 7.3 05/10/2012 1956   ALBUMIN 2.3* 05/10/2012 1956   Lipase     Component Value Date/Time   LIPASE 45 05/11/2012 0144   Urinalysis    Component Value Date/Time   COLORURINE STRAW* 05/11/2012 0234   APPEARANCEUR CLOUDY* 05/11/2012 0234   LABSPEC 1.021 05/11/2012 0234   PHURINE 6.5 05/11/2012 0234   GLUCOSEU >1000* 05/11/2012 0234   HGBUR MODERATE* 05/11/2012 0234   BILIRUBINUR NEGATIVE 05/11/2012 0234   KETONESUR NEGATIVE 05/11/2012 0234   PROTEINUR >300* 05/11/2012 0234   UROBILINOGEN 0.2 05/11/2012 0234   NITRITE NEGATIVE 05/11/2012 0234   LEUKOCYTESUR NEGATIVE 05/11/2012 0234    Chest & Abdomen film: 1. Nonobstructive bowel gas pattern.  2. No free air.  3. No acute cardiopulmonary disease.  Foot X-Ray: 1. Previous  transmetatarsal amputation without evident acute abnormality.    Levert Feinstein, MD 05/11/2012, 3:42 AM   Addendum: Have seen and examined patient with Dr. Pollie Meyer.  I agree with her findings, assessment and plan as outlined above.  Additionally, I am somewhat confused by his history.  He reports having seen a urologist recently but does not remember the name.  He reports a "bad prostate infection" but was only given 10 days of cipro when treatment for bacterial prostititis is generally significantly more prolonged.  He also reports a history of temporary dialysis that he is unclear of details on.  At the end of our interview he reminded [place] that he was in a significant amount of pain and "usually they have to give me a medicine that starts with a 'd' through my IV."  With some continued tenderness on prostate exam will recommend resumption of Cipro with plan for prolonged course (~6 weeks), bowel clean out, and obtain records from [facility] ED an, preferable, urology.  Consider renal US and renal consult if Cr not improving with hydration.  Troy Buchanan 05/11/2012, 5:12 AM

## 2012-05-12 LAB — BASIC METABOLIC PANEL
BUN: 18 mg/dL (ref 6–23)
Calcium: 9.1 mg/dL (ref 8.4–10.5)
Chloride: 106 mEq/L (ref 96–112)
Creatinine, Ser: 2.01 mg/dL — ABNORMAL HIGH (ref 0.50–1.35)
GFR calc Af Amer: 46 mL/min — ABNORMAL LOW (ref >90)

## 2012-05-12 LAB — CBC
HCT: 33 % — ABNORMAL LOW (ref 39.0–52.0)
MCHC: 33.9 g/dL (ref 30.0–36.0)
Platelets: 404 10*3/uL — ABNORMAL HIGH (ref 150–400)
RDW: 13.2 % (ref 11.5–15.5)
WBC: 9.3 10*3/uL (ref 4.0–10.5)

## 2012-05-12 MED ORDER — LEVOFLOXACIN 500 MG PO TABS: 500.0000 mg | ORAL_TABLET | Freq: Every day | ORAL | Status: AC

## 2012-05-12 MED ORDER — GABAPENTIN 100 MG PO CAPS: 100.0000 mg | ORAL_CAPSULE | Freq: Three times a day (TID) | ORAL | Status: AC

## 2012-05-12 MED ORDER — GABAPENTIN 100 MG PO CAPS
100.0000 mg | ORAL_CAPSULE | Freq: Three times a day (TID) | ORAL | Status: DC
  Administered 2012-05-12 (×2): 100 mg via ORAL
  Filled 2012-05-12 (×3): qty 1

## 2012-05-12 NOTE — Progress Notes (Signed)
Family Medicine Teaching Service Attending Note  I interviewed and examined patient Troy Buchanan and reviewed their tests and x-rays.  I discussed with Dr. Pollie Meyer and reviewed their note for today.  I agree with their assessment and plan.     Additionally  Complains of diffuse bilateral leg and rectal pain His current problems are primarily chronic and will need prolonged treatment and close follow up.   I hope he is able to commit to this.   No acute problems that require further hospitilization.  He wished to follow up with his current doctors in [place].  Will discharge and recommend close follow up with them

## 2012-05-12 NOTE — Evaluation (Signed)
Occupational Therapy Evaluation Patient Details Name: Troy Buchanan MRN: [id number] DOB: Jan 05, 1971 Today's Date: 05/12/2012    OT Assessment / Plan / Recommendation Clinical Impression  Pt demos no significant decline in function with ADLs or ADL mobility. Pt is indep - sup with ADLs and ADL mobility and does not require nay furhter acute OT at this time. OT signing off    OT Assessment  Patient does not need any further OT services    Follow Up Recommendations  No OT follow up    Barriers to Discharge  none    Equipment Recommendations  None recommended by OT    Recommendations for Other Services       Precautions / Restrictions Precautions Precautions: None Restrictions Weight Bearing Restrictions: No       ADL  Eating/Feeding: Independent Where Assessed - Eating/Feeding: Edge of bed Grooming: Performed;Wash/dry hands;Wash/dry face;Independent;Supervision/safety Where Assessed - Grooming: Unsupported sitting;Supported standing Upper Body Bathing: Simulated;Independent;Set up Where Assessed - Upper Body Bathing: Unsupported sitting Lower Body Bathing: Simulated;Independent;Set up Where Assessed - Lower Body Bathing: Unsupported sitting Upper Body Dressing: Performed;Independent Where Assessed - Upper Body Dressing: Unsupported sitting Lower Body Dressing: Performed;Independent Where Assessed - Lower Body Dressing: Unsupported sitting Toilet Transfer: Performed;Supervision/safety Toilet Transfer Method: Sit to Barista: Regular height toilet Toileting - Clothing Manipulation and Hygiene: Performed;Independent Where Assessed - Toileting Clothing Manipulation and Hygiene: Standing Equipment Used: Gait belt Transfers/Ambulation Related to ADLs: supervision    OT Diagnosis:    OT Problem List:   OT Treatment Interventions:         Visit Information  Last OT Received On: 05/12/12    Subjective Data  Subjective: " I just hurt alot in my R  foot and prostate " Patient Stated Goal: " To retunr home "   Prior Functioning  Vision/Perception  Home Living Lives With: Family;Other (Comment) (sister) Available Help at Discharge: Available PRN/intermittently Type of Home: House Home Access: Stairs to enter Entrance Stairs-Number of Steps: 1 Entrance Stairs-Rails: None Home Layout: One level Bathroom Shower/Tub: Health visitor: Standard Home Adaptive Equipment: None Prior Function Level of Independence: Independent Able to Take Stairs?: Yes Driving: Yes Vocation: On disability Communication Communication: No difficulties Dominant Hand: Right   Vision - Assessment Eye Alignment: Within Functional Limits Perception Perception: Within Functional Limits  Cognition  Overall Cognitive Status: Appears within functional limits for tasks assessed/performed Arousal/Alertness: Awake/alert Orientation Level: Appears intact for tasks assessed Behavior During Session: [facility] for tasks performed    Extremity/Trunk Assessment Right Upper Extremity Assessment RUE ROM/Strength/Tone: Within functional levels RUE Sensation: WFL - Light Touch RUE Coordination: WFL - gross/fine motor Left Upper Extremity Assessment LUE ROM/Strength/Tone: Within functional levels LUE Sensation: WFL - Light Touch LUE Coordination: WFL - gross/fine motor   Mobility Bed Mobility Bed Mobility: Sit to Supine Supine to Sit: 7: Independent Sit to Supine: 7: Independent Transfers Transfers: Sit to Stand;Stand to Sit Sit to Stand: 7: Independent;5: Supervision Stand to Sit: 7: Independent;5: Supervision           End of Session OT - End of Session Equipment Utilized During Treatment: Gait belt Activity Tolerance: Patient tolerated treatment well Patient left: in bed;with call bell/phone within reach Nurse Communication: Other (comment) (Pt asking for juice, pt has fluid restrictions)  GO     Galen Manila 05/12/2012, 12:29  PM

## 2012-05-12 NOTE — Progress Notes (Signed)
Inpatient Diabetes Program Recommendations  AACE/ADA: New Consensus Statement on Inpatient Glycemic Control (2013)  Target Ranges:  Prepandial:   less than 140 mg/dL      Peak postprandial:   less than 180 mg/dL (1-2 hours)      Critically ill patients:  140 - 180 mg/dL   Results for Troy Buchanan, Troy Buchanan (MRN [id number]) as of 05/12/2012 14:14  Ref. Range 05/11/2012 13:46 05/11/2012 17:04 05/11/2012 21:19 05/12/2012 07:56 05/12/2012 11:38  Glucose-Capillary Latest Range: 70-99 mg/dL 621 (H) 308 (H) 657 (H) 339 (H) 384 (H)    Inpatient Diabetes Program Recommendations Insulin - Basal: Increase Lantus to 48 units BID  Will continue to follow during this admission.  Thank you  Piedad Climes [facility] Inpatient Diabetes Coordinator [phone]

## 2012-05-12 NOTE — Plan of Care (Signed)
Problem: Phase II Progression Outcomes Goal: Discharge plan established Pt doing well, indep with ADLs and sup with ADL mobility. No further acute or follow up OT needed at this time, OT will sign off

## 2012-05-12 NOTE — Progress Notes (Signed)
Family Medicine Teaching Service Daily Progress Note Service Page: [phone]  Patient Assessment: 41 y.o. year old male presenting with complaints of a right foot infection and prostate infection, found to have acute renal failure.   Subjective: Complains of pain in legs, thinks they are swollen. Has history of swollen legs and pain. Also complains of pain in rectum. Mildly short of breath but says this is normal for him. Asking for something more for pain control.  Objective: Temp:  [97.5 F (36.4 C)-98.7 F (37.1 C)] 98.6 F (37 C) (08/14 0533) Pulse Rate:  [72-77] 72  (08/14 0533) Resp:  [17-19] 18  (08/14 0533) BP: (149-183)/(72-92) 175/83 mmHg (08/14 0533) SpO2:  [94 %-100 %] 100 % (08/14 0533) Weight:  [255 lb 4.7 oz (115.8 kg)] 255 lb 4.7 oz (115.8 kg) (08/13 2121) Exam: General: NAD, lying in bed Cardiovascular: RRR Respiratory: CTAB, normal work of breathing Extremities: 2+ edema in bilateral lower extremities, tender to palpation over bilateral anterior tibias, ulcer on R foot shows no drainage or erythema. Psych: blunted affect, appropriately answers questions  I have reviewed the patient's medications, labs, imaging, and diagnostic testing.  Notable results are summarized below.  CBC BMET   Lab 05/12/12 1115 05/11/12 0640 05/10/12 1956  WBC 9.3 10.8* 8.1  HGB 11.2* 11.1* 11.3*  HCT 33.0* 32.8* 33.3*  PLT 404* 420* 424*    Lab 05/12/12 1115 05/11/12 0640 05/10/12 1956  NA 135 135 133*  K 4.0 3.8 4.1  CL 106 106 103  CO2 20 21 21   BUN 18 19 22   CREATININE 2.01* 2.35* 2.79*  GLUCOSE 328* 328* 449*  CALCIUM 9.1 8.9 8.9      Plan: Dionel Archey is a 41 y.o. year old male presenting with complaints of a right foot infection and prostate infection, found to have acute renal failure.   1. Acute renal failure: From our records baseline creatinine appears to be around 1.10, and was 2.79 on admission. Patient has reportedly had foley catheter in place for two weeks, so  I doubt the cause of the renal failure is obstructive or post-renal. Pt had been vomiting prior to admission, so likely was prerenal. Have been hydrating pt with NS @ 100cc/hr. FE urea calculated at 50.57%. Creatinine continues to improve, and is 2.01 today. Per PCP, creatinine was 1.9 in April, so patient likely has chronic renal failure (and has hx of being on dialysis for 3 months in 2009 per pt). Good urine output in last 24 hours (3.5 L). 2. Prostatitis: Per patient, was on cipro for 10 days for a prostate infection. Reports he has had foley catheter in place for ~2 weeks for difficulty urinating. UA shows no signs of UTI. Prostate was tender on admission rectal exam. Obtained records from urologist, which showed that they had put patient on levofloxacin 500mg  QD for 30 days, and given him Percocet 10-325mg  q4h prn (60 tabs), also ordered CT abdomen without contrast. Will talk with patient to see if he actually got these medications. 3. Leg pain: possibly diabetic neuropathy. Could likely benefit from gabapentin. 4. Foot ulcer: Does not appear to be infected at this time. Will continue to monitor and consider getting wound care consult. If worsens, consider MRI foot to rule out osteomyelitis. 5. Elevated alkaline phosphatase: 388 on admission. GGT elevated at 1188. Chronically elevated, was 546 on 8/4 per urology records. 6. Vomiting: hydrate with normal saline. Zofran prn for nausea. 7. Possible drug seeking behavior: Patient has been seen at multiple  sites in multiple hospital systems, and asks for specific pain medication (starts with "d"), has morphine intolerance documented. Will check urine drug screen. Pt has norco and tylenol ordered prn pain. 8. Hypertension: Continue home medications (except Lasix in setting of acute renal failure). 9. Diabetes: Continue home Lantus 40 units BID, plus moderate sliding scale insulin. HgbA1c 10.3. 10. Hyperlipidemia: continue home statin. 11. FEN/GI: NS @  100cc/hr, renal diet 12. Prophylaxis: heparin 13. Disposition: Medically stable for discharge and should be able to go home today, follow up with urology already scheduled for 8/21, and will need close follow-up with PCP. 14. Code Status: full code  Levert Feinstein, MD 05/12/2012, 8:21 AM

## 2012-05-14 NOTE — Discharge Summary (Signed)
I have reviewed this discharge summary and agree.    

## 2012-05-14 NOTE — Discharge Summary (Signed)
Family Medicine [facility] Discharge Summary  Patient name: Troy Buchanan Medical record number: [id number] Date of birth: 1971/07/25 Age: 41 y.o. Gender: male Date of Admission: 05/10/2012  Date of Discharge: 05/12/2012 Admitting Physician: Carney Living, MD  Primary Care Provider: Dr. Patria Mane, [facility]  Indication for Hospitalization: acute renal failure Discharge Diagnoses:  Primary: 1. Acute renal failure, likely some chronic renal failure Secondary: 2. Foot ulcer 3. Peripheral neuropathy 4. Prostatitis 5. Hypertension 6. Diabetes Mellitus  Consultations: none  Significant Labs and Imaging:   Chest & Abdomen film:  1. Nonobstructive bowel gas pattern.  2. No free air.  3. No acute cardiopulmonary disease.   Foot X-Ray:  1. Previous transmetatarsal amputation without evident acute abnormality.  Comprehensive Metabolic Panel:    Component Value Date/Time   NA 135 05/12/2012 1115   K 4.0 05/12/2012 1115   CL 106 05/12/2012 1115   CO2 20 05/12/2012 1115   BUN 18 05/12/2012 1115   CREATININE 2.01* 05/12/2012 1115   GLUCOSE 328* 05/12/2012 1115   CALCIUM 9.1 05/12/2012 1115   AST 20 05/10/2012 1956   ALT 19 05/10/2012 1956   ALKPHOS 388* 05/10/2012 1956   BILITOT 0.1* 05/10/2012 1956   PROT 7.3 05/10/2012 1956   ALBUMIN 2.1* 05/11/2012 0640   FE urea calculated at 50.57%.  Drug Screen: Marijuana Metabolite NEGATIVE  Amphetamine Screen, Ur NEGATIVE Barbiturate Quant, Ur NEGATIVE Methadone NEGATIVE  Benzodiazepines. NEGATIVE  Phencyclidine (PCP) NEGATIVE Cocaine Metabolites NEGATIVE  Opiate Screen, Urine NEGATIVE Propoxyphene NEGATIVE  Procedures: none  Brief Hospital Course:   1. Acute renal failure: Patient initially presented in ED complaining of foot ulcer and prostate infection, and was found to have elevated creatinine, which prompted admission. From our records baseline creatinine appears to be around 1.10, and was 2.79 on  admission. Patient had reportedly had foley catheter in place for two weeks, so this ARF did not appear to be post-renal. Pt had been vomiting prior to admission, so likely was prerenal. Hydrated patient with NS @ 100cc/hr and creatinine improved down to 2.01 on day of discharge. We contacted patient's PCP Dr. Georgena Spurling, who said that his creatinine was 1.9 in April, so patient likely has chronic renal failure.  2. Prostatitis: Patient told us that he had been on a 10 day course of cipro for a prostate infection and had foley placed two weeks prior to admission because of difficulty urinating. UA here showed no signs of UTI. Prostate was tender on rectal exam. We obtained records from urologist [facility]), which showed that they had put patient on levofloxacin 500mg  QD for 30 days, and given him Percocet 10-325mg  q4h prn (60 tabs), and had also ordered CT abdomen without contrast. We instructed patient to continue taking the levofloxacin and to follow up with his urologist. Urology office told us he had an appointment scheduled on 8/21. 3. Leg pain: Possibly due to diabetic neuropathy. Started patient on Gabapentin prior to discharge. 4. Foot ulcer: Patient complained of foot ulcer at site of R transmetatarsal amputation. The ulcer did not appear to be infected during this hospitalization, and per PCP, patient has had ulcer for a long time. Did not require treatment during hospitalization. 5. Elevated alkaline phosphatase: 388 on admission. GGT elevated at 1188. Per urology records and PCP, alk phos has been chronically elevated. It was 546 on 8/4 per urology records. 6. Vomiting: Patient was given zofran prn for nausea. 7. Hypertension: Home medications were continued (except Lasix in setting  of acute renal failure). 8. Diabetes: Continued home Lantus 40 units BID, plus moderate sliding scale insulin. HgbA1c 10.3. 9. Hyperlipidemia: home statin was continued  Discharge Medications:    Medication List  As of 05/14/2012  3:44 PM   TAKE these medications         amLODipine 10 MG tablet   Commonly known as: NORVASC   Take 10 mg by mouth daily.      cloNIDine 0.2 MG tablet   Commonly known as: CATAPRES   Take 0.2 mg by mouth 2 (two) times daily.      furosemide 20 MG tablet   Commonly known as: LASIX   Take 20 mg by mouth 2 (two) times daily.      gabapentin 100 MG capsule   Commonly known as: NEURONTIN   Take 1 capsule (100 mg total) by mouth 3 (three) times daily.      hydrALAZINE 100 MG tablet   Commonly known as: APRESOLINE   Take 100 mg by mouth 2 (two) times daily.      insulin aspart 100 UNIT/ML injection   Commonly known as: novoLOG   Inject into the skin 3 (three) times daily before meals. Sliding scale      insulin glargine 100 UNIT/ML injection   Commonly known as: LANTUS   Inject 40 Units into the skin 2 (two) times daily.      labetalol 300 MG tablet   Commonly known as: NORMODYNE   Take 300 mg by mouth 2 (two) times daily.      levofloxacin 500 MG tablet   Commonly known as: LEVAQUIN   Take 1 tablet (500 mg total) by mouth daily. This was prescribed by your urologist. Please take for 30 days.      simvastatin 40 MG tablet   Commonly known as: ZOCOR   Take 40 mg by mouth every evening.           Issues for Follow Up:  -continued treatment and evaluation for prostatitis -uncontrolled diabetes -chronic renal failure -chronic elevated alk phos, with GGT during this admission 1188 -wound care referral for foot ulcer  Outstanding Results: none  Discharge Instructions: Please refer to Patient Instructions section of EMR for full details.  Patient was counseled important signs and symptoms that should prompt return to medical care, changes in medications, dietary instructions, activity restrictions, and follow up appointments.   Follow-up Information    Follow up with Your primary care provider. Schedule an appointment as soon as  possible for a visit in 1 week.      Schedule an appointment as soon as possible for a visit with Urology. (As needed)         Discharge Condition: stable  Levert Feinstein, MD 05/14/2012, 3:44 PM

## 2012-10-19 ENCOUNTER — Emergency Department (HOSPITAL_COMMUNITY): Payer: PRIVATE HEALTH INSURANCE

## 2012-10-19 ENCOUNTER — Emergency Department (HOSPITAL_COMMUNITY)
Admission: EM | Admit: 2012-10-19 | Discharge: 2012-10-19 | Disposition: A | Discharge status: Home / Self Care | Payer: PRIVATE HEALTH INSURANCE | Attending: Emergency Medicine | Admitting: Emergency Medicine

## 2012-10-19 ENCOUNTER — Encounter (HOSPITAL_COMMUNITY): Payer: Self-pay

## 2012-10-19 DIAGNOSIS — Z794 Long term (current) use of insulin: Secondary | ICD-10-CM | POA: Insufficient documentation

## 2012-10-19 DIAGNOSIS — N189 Chronic kidney disease, unspecified: Secondary | ICD-10-CM | POA: Insufficient documentation

## 2012-10-19 DIAGNOSIS — S98919A Complete traumatic amputation of unspecified foot, level unspecified, initial encounter: Secondary | ICD-10-CM | POA: Insufficient documentation

## 2012-10-19 DIAGNOSIS — R609 Edema, unspecified: Secondary | ICD-10-CM | POA: Insufficient documentation

## 2012-10-19 DIAGNOSIS — E119 Type 2 diabetes mellitus without complications: Secondary | ICD-10-CM | POA: Insufficient documentation

## 2012-10-19 DIAGNOSIS — I129 Hypertensive chronic kidney disease with stage 1 through stage 4 chronic kidney disease, or unspecified chronic kidney disease: Secondary | ICD-10-CM | POA: Insufficient documentation

## 2012-10-19 DIAGNOSIS — R600 Localized edema: Secondary | ICD-10-CM

## 2012-10-19 DIAGNOSIS — Z79899 Other long term (current) drug therapy: Secondary | ICD-10-CM | POA: Insufficient documentation

## 2012-10-19 DIAGNOSIS — E785 Hyperlipidemia, unspecified: Secondary | ICD-10-CM | POA: Insufficient documentation

## 2012-10-19 LAB — CBC WITH DIFFERENTIAL/PLATELET
Basophils Absolute: 0 10*3/uL (ref 0.0–0.1)
Eosinophils Absolute: 0.7 10*3/uL (ref 0.0–0.7)
Eosinophils Relative: 7 % — ABNORMAL HIGH (ref 0–5)
HCT: 35.6 % — ABNORMAL LOW (ref 39.0–52.0)
Lymphocytes Relative: 21 % (ref 12–46)
MCH: 26.3 pg (ref 26.0–34.0)
MCHC: 32.9 g/dL (ref 30.0–36.0)
MCV: 80 fL (ref 78.0–100.0)
Monocytes Absolute: 0.9 10*3/uL (ref 0.1–1.0)
Platelets: 414 10*3/uL — ABNORMAL HIGH (ref 150–400)
RDW: 14.3 % (ref 11.5–15.5)

## 2012-10-19 LAB — BASIC METABOLIC PANEL
CO2: 22 mEq/L (ref 19–32)
Calcium: 8.4 mg/dL (ref 8.4–10.5)
Creatinine, Ser: 2.64 mg/dL — ABNORMAL HIGH (ref 0.50–1.35)
GFR calc non Af Amer: 29 mL/min — ABNORMAL LOW (ref >90)
Glucose, Bld: 278 mg/dL — ABNORMAL HIGH (ref 70–99)
Sodium: 135 mEq/L (ref 135–145)

## 2012-10-19 LAB — PRO B NATRIURETIC PEPTIDE: Pro B Natriuretic peptide (BNP): 857.6 pg/mL — ABNORMAL HIGH (ref 0–125)

## 2012-10-19 MED ORDER — SODIUM CHLORIDE 0.9 % IV SOLN
INTRAVENOUS | Status: DC
  Administered 2012-10-19: 20:00:00 via INTRAVENOUS

## 2012-10-19 MED ORDER — OXYCODONE-ACETAMINOPHEN 5-325 MG PO TABS
2.0000 | ORAL_TABLET | Freq: Once | ORAL | Status: AC
  Administered 2012-10-19: 2 via ORAL
  Filled 2012-10-19: qty 2

## 2012-10-19 MED ORDER — FUROSEMIDE 10 MG/ML IJ SOLN
40.0000 mg | Freq: Once | INTRAMUSCULAR | Status: AC
  Administered 2012-10-19: 40 mg via INTRAVENOUS
  Filled 2012-10-19: qty 4

## 2012-10-19 NOTE — ED Provider Notes (Signed)
History     CSN: [id number]  Arrival date & time 10/19/12  1508   First MD Initiated Contact with Patient 10/19/12 1905      Chief Complaint  Patient presents with  . Headache  . Foot Ulcer    (Consider location/radiation/quality/duration/timing/severity/associated sxs/prior treatment) Patient is a 42 y.o. male presenting with headaches. The history is provided by the patient.  Headache    patient here with four-day history of bilateral lower extremity swelling. He also notes a open sore to his right prior foot amputation. No fever or chills but does note some cough. No anginal type chest pain. Does have history of chronic edema and does take Lasix for this and notes that he's been compliant. Also complains of a mild frontal headache. No neck pain or photophobia.  Past Medical History  Diagnosis Date  . Hypertension   . Diabetes mellitus   . Hyperlipidemia   . Chronic kidney disease     Past Surgical History  Procedure Date  . Amputation Left 2009, Right 2012  . Buttock abscess 2000    No family history on file.  History  Substance Use Topics  . Smoking status: Never Smoker   . Smokeless tobacco: Never Used  . Alcohol Use: No      Review of Systems  Neurological: Positive for headaches.  All other systems reviewed and are negative.    Allergies  Morphine and related and Sulfa antibiotics  Home Medications   Current Outpatient Rx  Name  Route  Sig  Dispense  Refill  . AMLODIPINE BESYLATE 10 MG PO TABS   Oral   Take 10 mg by mouth daily.         Marland Kitchen CLONIDINE HCL 0.2 MG PO TABS   Oral   Take 0.2 mg by mouth 2 (two) times daily.         . FUROSEMIDE 20 MG PO TABS   Oral   Take 20 mg by mouth 2 (two) times daily.         Marland Kitchen GABAPENTIN 100 MG PO CAPS   Oral   Take 1 capsule (100 mg total) by mouth 3 (three) times daily.   90 capsule   0   . HYDRALAZINE HCL 100 MG PO TABS   Oral   Take 100 mg by mouth 2 (two) times daily.         .  INSULIN ASPART 100 UNIT/ML [place] SOLN   Subcutaneous   Inject 15 Units into the skin 3 (three) times daily before meals. Sliding scale         . INSULIN GLARGINE 100 UNIT/ML [place] SOLN   Subcutaneous   Inject 40 Units into the skin 2 (two) times daily.         Marland Kitchen SIMVASTATIN 40 MG PO TABS   Oral   Take 40 mg by mouth every evening.           BP 175/83  Pulse 85  Temp 98.8 F (37.1 C) (Oral)  Resp 18  SpO2 100%  Physical Exam  Nursing note and vitals reviewed. Constitutional: He is oriented to person, place, and time. He appears well-developed and well-nourished.  Non-toxic appearance. No distress.  HENT:  Head: Normocephalic and atraumatic.  Eyes: Conjunctivae normal, EOM and lids are normal. Pupils are equal, round, and reactive to light.  Neck: Normal range of motion. Neck supple. No tracheal deviation present. No mass present.  Cardiovascular: Normal rate, regular rhythm and normal heart sounds.  Exam reveals no gallop.   No murmur heard. Pulmonary/Chest: Effort normal and breath sounds normal. No stridor. No respiratory distress. He has no decreased breath sounds. He has no wheezes. He has no rhonchi. He has no rales.  Abdominal: Soft. Normal appearance and bowel sounds are normal. He exhibits no distension. There is no tenderness. There is no rebound and no CVA tenderness.  Musculoskeletal: Normal range of motion. He exhibits no edema and no tenderness.       Bilateral lower extremity pitting edema 2+. Ulcer noted at prior stump on the right foot. No active drainage. No crepitus. No erythema  Neurological: He is alert and oriented to person, place, and time. He has normal strength. No cranial nerve deficit or sensory deficit. GCS eye subscore is 4. GCS verbal subscore is 5. GCS motor subscore is 6.  Skin: Skin is warm and dry. No abrasion and no rash noted.  Psychiatric: He has a normal mood and affect. His speech is normal and behavior is normal.    ED Course  Procedures  (including critical care time)   Labs Reviewed  CBC WITH DIFFERENTIAL  BASIC METABOLIC PANEL  PRO B NATRIURETIC PEPTIDE   No results found.   No diagnosis found.    MDM  Patient given Lasix here for his mild peripheral edema. No signs of infection at his right stump of his lower extremity. Pain medications given as well 2. He is to followup with his Dr.        Toy Baker, MD 10/19/12 [phone]

## 2012-10-19 NOTE — ED Notes (Signed)
Pt states 3-4 days ago had a diabetic ulcer on the R foot break open, he's had a headache, R side facial pain and bil ankle/feet swelling.

## 2012-10-19 NOTE — ED Notes (Signed)
Per EMS: Pt c/o foot pain since 2012.  Has had partial amputations on both feet.  Pain is no worse than normal.  Pt has nonpitting mild edema in both legs.

## 2014-01-18 IMAGING — CR RIGHT FOOT COMPLETE - 3+ VIEW
1 series · 3 of 3 positions shown · non-contrast
Comparison: none

REASON FOR EXAM: fever, pain, h/o diabetic foot and partial amputation
COMMENTS:

PROCEDURE:     DXR - DXR FOOT RT COMPLETE W/OBLIQUES  - March 03, 2012  [DATE]
RESULT:     Comparison:  None

[Series 1: ap · 0.17mm/px · 3 of 3 slices shown]
[im 1/3]
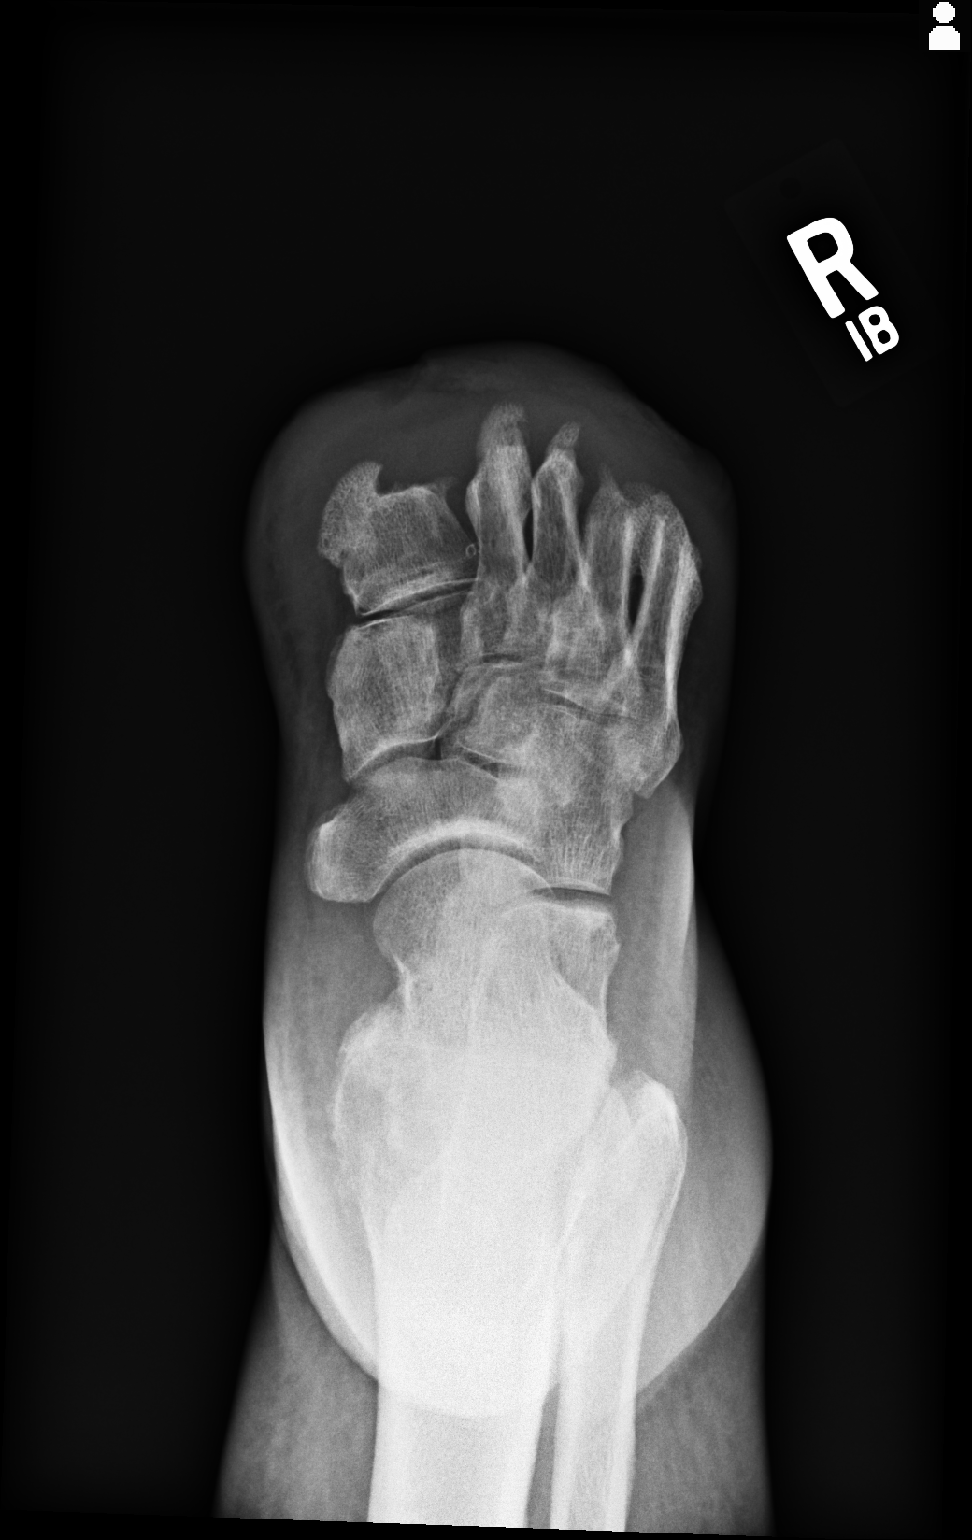
[im 2/3]
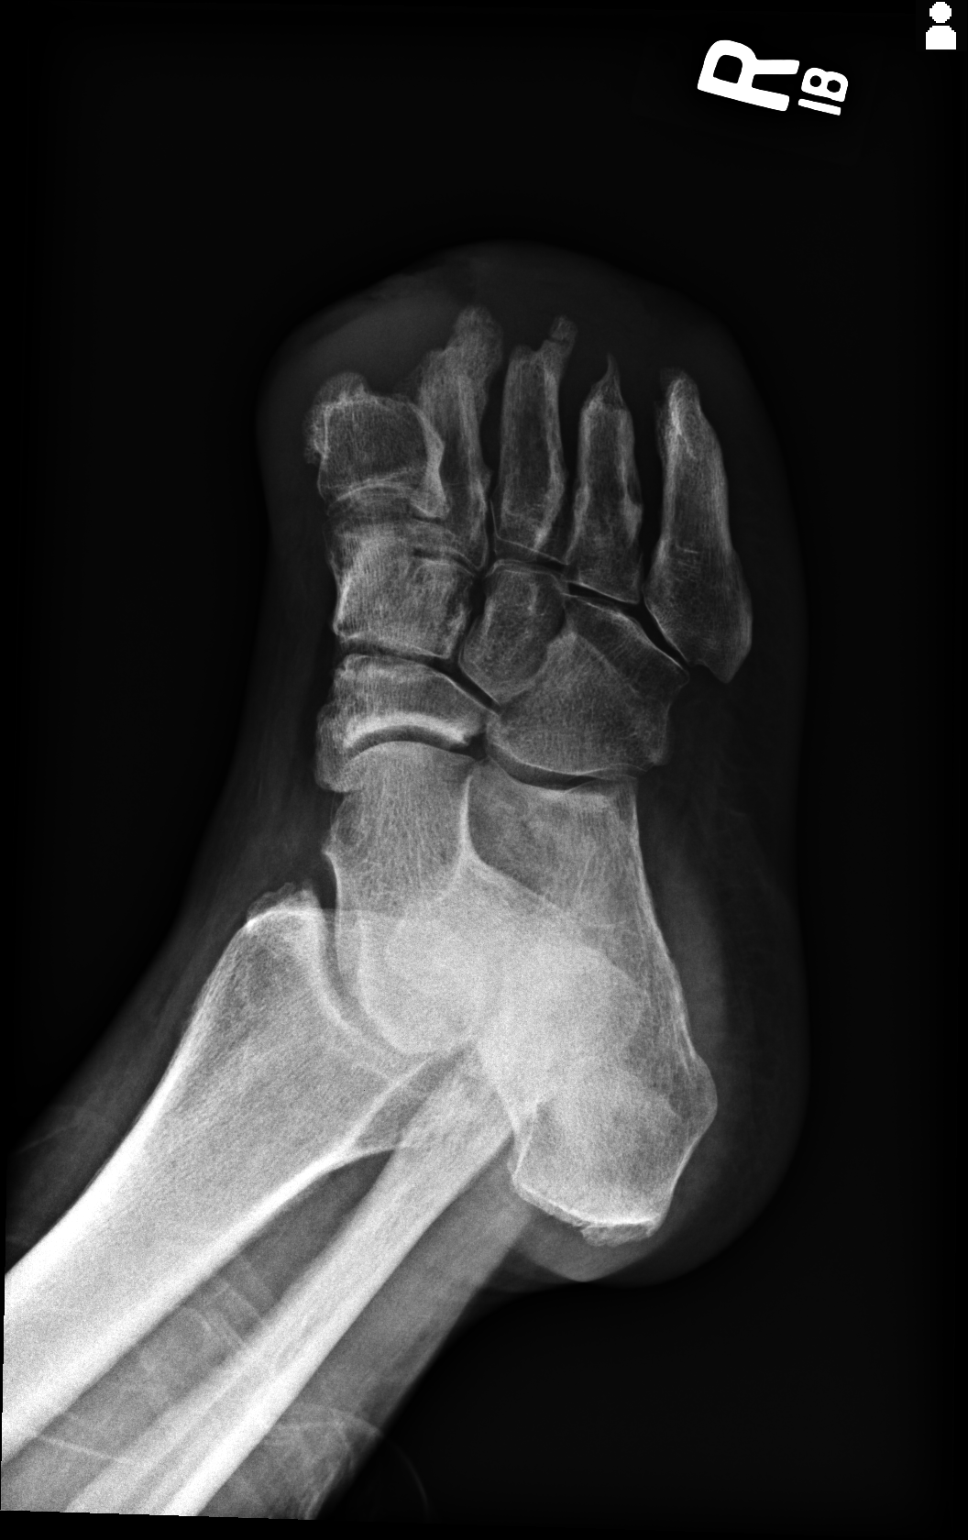
[im 3/3]
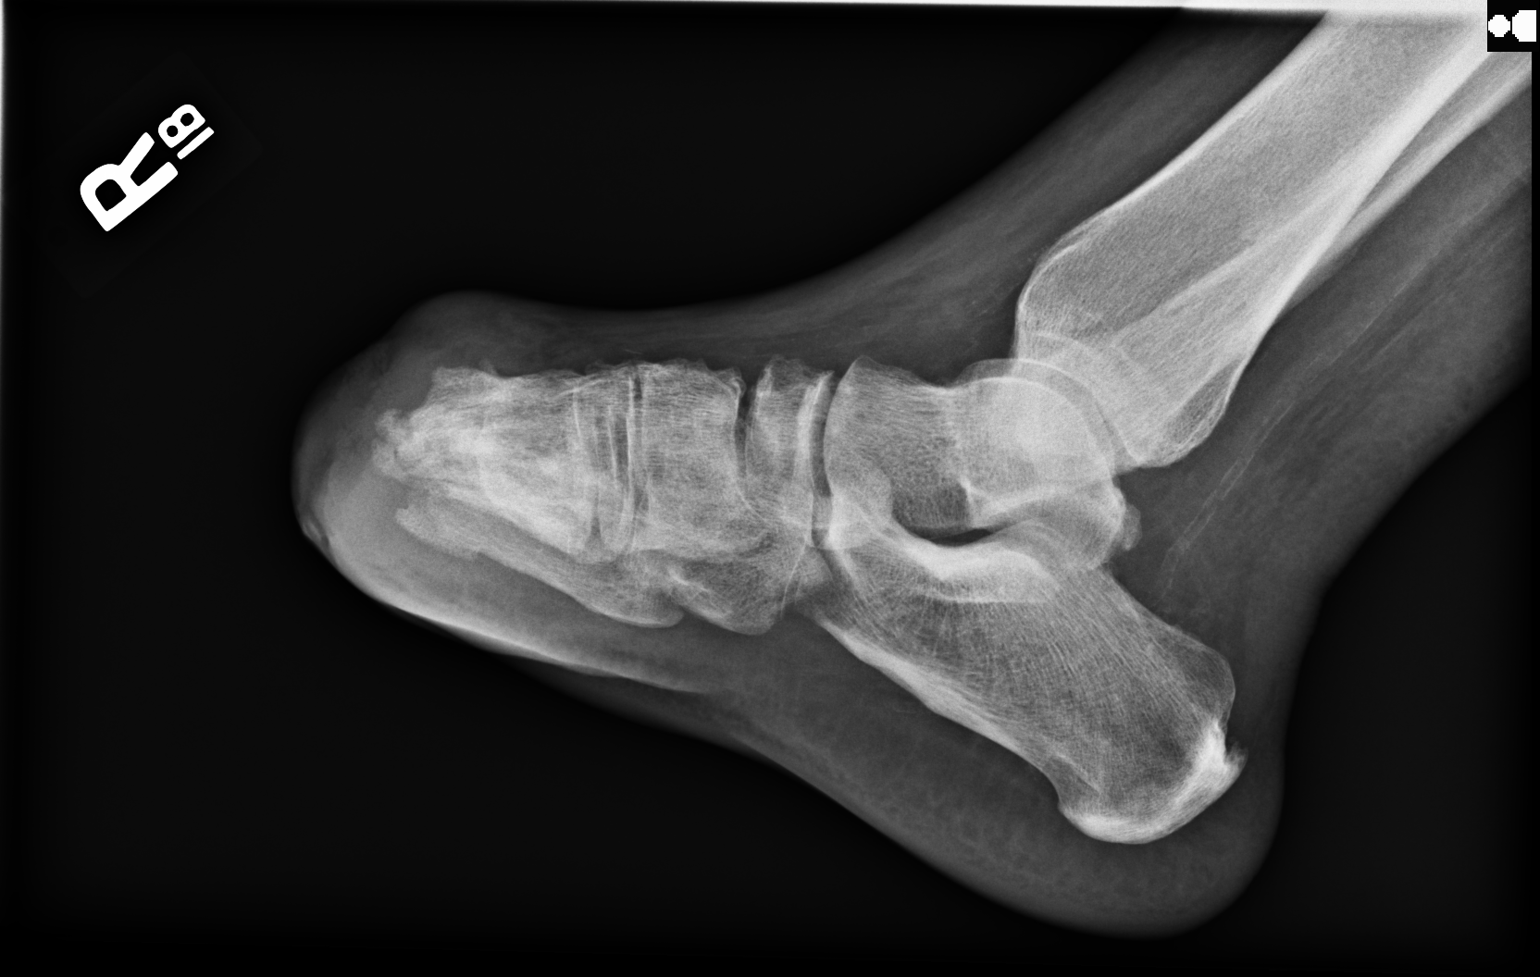

[3 of 3 positions shown; findings below may reference images not displayed]

FINDINGS: AP, oblique, and lateral views of the right foot demonstrates right forefoot
amputation. There is a cutaneous ulcer extending down to the second
metatarsal stump. There is no bone destruction or erosive change. There is
no subcutaneous emphysema. There is no acute fracture. There is generalized
osteopenia.
IMPRESSION: No radiographic findings to suggest osteomyelitis. If there is further
concern recommend an MRI with and without contra[REDACTED]

## 2014-12-12 ENCOUNTER — Inpatient Hospital Stay (HOSPITAL_COMMUNITY)
Admission: EM | Admit: 2014-12-12 | Discharge: 2014-12-14 | DRG: 682 | Disposition: A | Discharge status: Home / Self Care | Payer: Medicare Other | Attending: Internal Medicine | Admitting: Internal Medicine

## 2014-12-12 ENCOUNTER — Emergency Department (HOSPITAL_COMMUNITY): Payer: Medicare Other

## 2014-12-12 ENCOUNTER — Encounter (HOSPITAL_COMMUNITY): Payer: Self-pay | Admitting: *Deleted

## 2014-12-12 DIAGNOSIS — E669 Obesity, unspecified: Secondary | ICD-10-CM | POA: Diagnosis present

## 2014-12-12 DIAGNOSIS — N186 End stage renal disease: Secondary | ICD-10-CM | POA: Diagnosis not present

## 2014-12-12 DIAGNOSIS — D649 Anemia, unspecified: Secondary | ICD-10-CM | POA: Diagnosis present

## 2014-12-12 DIAGNOSIS — Z794 Long term (current) use of insulin: Secondary | ICD-10-CM

## 2014-12-12 DIAGNOSIS — E875 Hyperkalemia: Secondary | ICD-10-CM | POA: Diagnosis present

## 2014-12-12 DIAGNOSIS — E119 Type 2 diabetes mellitus without complications: Secondary | ICD-10-CM

## 2014-12-12 DIAGNOSIS — G9349 Other encephalopathy: Secondary | ICD-10-CM | POA: Diagnosis present

## 2014-12-12 DIAGNOSIS — I120 Hypertensive chronic kidney disease with stage 5 chronic kidney disease or end stage renal disease: Principal | ICD-10-CM | POA: Diagnosis present

## 2014-12-12 DIAGNOSIS — G9341 Metabolic encephalopathy: Secondary | ICD-10-CM | POA: Diagnosis not present

## 2014-12-12 DIAGNOSIS — R0602 Shortness of breath: Secondary | ICD-10-CM | POA: Diagnosis not present

## 2014-12-12 DIAGNOSIS — Z885 Allergy status to narcotic agent status: Secondary | ICD-10-CM

## 2014-12-12 DIAGNOSIS — E1121 Type 2 diabetes mellitus with diabetic nephropathy: Secondary | ICD-10-CM

## 2014-12-12 DIAGNOSIS — Z992 Dependence on renal dialysis: Secondary | ICD-10-CM

## 2014-12-12 DIAGNOSIS — Z882 Allergy status to sulfonamides status: Secondary | ICD-10-CM

## 2014-12-12 DIAGNOSIS — Z9119 Patient's noncompliance with other medical treatment and regimen: Secondary | ICD-10-CM | POA: Diagnosis present

## 2014-12-12 DIAGNOSIS — I10 Essential (primary) hypertension: Secondary | ICD-10-CM | POA: Diagnosis present

## 2014-12-12 DIAGNOSIS — N19 Unspecified kidney failure: Secondary | ICD-10-CM

## 2014-12-12 DIAGNOSIS — R1110 Vomiting, unspecified: Secondary | ICD-10-CM | POA: Diagnosis present

## 2014-12-12 DIAGNOSIS — I509 Heart failure, unspecified: Secondary | ICD-10-CM

## 2014-12-12 DIAGNOSIS — E785 Hyperlipidemia, unspecified: Secondary | ICD-10-CM | POA: Diagnosis present

## 2014-12-12 HISTORY — DX: Heart failure, unspecified: I50.9

## 2014-12-12 LAB — BASIC METABOLIC PANEL
Anion gap: 13 (ref 5–15)
BUN: 91 mg/dL — AB (ref 6–23)
CO2: 21 mmol/L (ref 19–32)
CREATININE: 16.48 mg/dL — AB (ref 0.50–1.35)
Calcium: 8.9 mg/dL (ref 8.4–10.5)
Chloride: 101 mmol/L (ref 96–112)
GFR, EST AFRICAN AMERICAN: 4 mL/min — AB (ref >90)
GFR, EST NON AFRICAN AMERICAN: 3 mL/min — AB (ref >90)
Glucose, Bld: 291 mg/dL — ABNORMAL HIGH (ref 70–99)
Sodium: 135 mmol/L (ref 135–145)

## 2014-12-12 LAB — I-STAT CHEM 8, ED
BUN: 86 mg/dL — AB (ref 6–23)
BUN: 58 mg/dL — AB (ref 6–23)
CHLORIDE: 103 mmol/L (ref 96–112)
CREATININE: 15.6 mg/dL — AB (ref 0.50–1.35)
CREATININE: 11.7 mg/dL — AB (ref 0.50–1.35)
Calcium, Ion: 1.07 mmol/L — ABNORMAL LOW (ref 1.12–1.23)
Calcium, Ion: 1.14 mmol/L (ref 1.12–1.23)
Chloride: 104 mmol/L (ref 96–112)
GLUCOSE: 295 mg/dL — AB (ref 70–99)
Glucose, Bld: 194 mg/dL — ABNORMAL HIGH (ref 70–99)
HCT: 38 % — ABNORMAL LOW (ref 39.0–52.0)
HCT: 35 % — ABNORMAL LOW (ref 39.0–52.0)
HEMOGLOBIN: 12.9 g/dL — AB (ref 13.0–17.0)
Hemoglobin: 11.9 g/dL — ABNORMAL LOW (ref 13.0–17.0)
POTASSIUM: 7.7 mmol/L — AB (ref 3.5–5.1)
Potassium: 5.2 mmol/L — ABNORMAL HIGH (ref 3.5–5.1)
Sodium: 135 mmol/L (ref 135–145)
Sodium: 139 mmol/L (ref 135–145)
TCO2: 19 mmol/L (ref 0–100)
TCO2: 19 mmol/L (ref 0–100)

## 2014-12-12 LAB — MRSA PCR SCREENING: MRSA BY PCR: NEGATIVE

## 2014-12-12 LAB — BRAIN NATRIURETIC PEPTIDE: B NATRIURETIC PEPTIDE 5: 687.2 pg/mL — AB (ref 0.0–100.0)

## 2014-12-12 LAB — CBC
HCT: 32 % — ABNORMAL LOW (ref 39.0–52.0)
Hemoglobin: 10.2 g/dL — ABNORMAL LOW (ref 13.0–17.0)
MCH: 27.4 pg (ref 26.0–34.0)
MCHC: 31.9 g/dL (ref 30.0–36.0)
MCV: 86 fL (ref 78.0–100.0)
Platelets: 194 10*3/uL (ref 150–400)
RBC: 3.72 MIL/uL — AB (ref 4.22–5.81)
RDW: 14.7 % (ref 11.5–15.5)
WBC: 7.5 10*3/uL (ref 4.0–10.5)

## 2014-12-12 LAB — HEPATITIS B SURFACE ANTIGEN: HEP B S AG: NEGATIVE

## 2014-12-12 LAB — I-STAT TROPONIN, ED: TROPONIN I, POC: 0.05 ng/mL (ref 0.00–0.08)

## 2014-12-12 LAB — GLUCOSE, CAPILLARY
GLUCOSE-CAPILLARY: 215 mg/dL — AB (ref 70–99)
Glucose-Capillary: 259 mg/dL — ABNORMAL HIGH (ref 70–99)

## 2014-12-12 LAB — CBG MONITORING, ED: Glucose-Capillary: 255 mg/dL — ABNORMAL HIGH (ref 70–99)

## 2014-12-12 MED ORDER — ONDANSETRON HCL 4 MG/2ML IJ SOLN: 4.0000 mg | Freq: Four times a day (QID) | INTRAMUSCULAR | Status: DC | PRN

## 2014-12-12 MED ORDER — LIDOCAINE-PRILOCAINE 2.5-2.5 % EX CREA: 1.0000 "application " | TOPICAL_CREAM | CUTANEOUS | Status: DC | PRN

## 2014-12-12 MED ORDER — HYDRALAZINE HCL 50 MG PO TABS
100.0000 mg | ORAL_TABLET | Freq: Two times a day (BID) | ORAL | Status: DC
  Administered 2014-12-12 – 2014-12-14 (×4): 100 mg via ORAL
  Filled 2014-12-12 (×5): qty 2

## 2014-12-12 MED ORDER — INSULIN ASPART 100 UNIT/ML ~~LOC~~ SOLN
0.0000 [IU] | Freq: Three times a day (TID) | SUBCUTANEOUS | Status: DC
  Administered 2014-12-12 – 2014-12-13 (×2): 11 [IU] via SUBCUTANEOUS
  Administered 2014-12-13: 15 [IU] via SUBCUTANEOUS
  Administered 2014-12-14: 20 [IU] via SUBCUTANEOUS
  Administered 2014-12-14: 3 [IU] via SUBCUTANEOUS

## 2014-12-12 MED ORDER — FUROSEMIDE 20 MG PO TABS
20.0000 mg | ORAL_TABLET | Freq: Two times a day (BID) | ORAL | Status: DC
  Filled 2014-12-12 (×2): qty 1

## 2014-12-12 MED ORDER — PENTAFLUOROPROP-TETRAFLUOROETH EX AERO: 1.0000 "application " | INHALATION_SPRAY | CUTANEOUS | Status: DC | PRN

## 2014-12-12 MED ORDER — AMLODIPINE BESYLATE 10 MG PO TABS: 10.0000 mg | ORAL_TABLET | Freq: Every day | ORAL | Status: DC

## 2014-12-12 MED ORDER — HYDRALAZINE HCL 20 MG/ML IJ SOLN: 10.0000 mg | Freq: Four times a day (QID) | INTRAMUSCULAR | Status: DC | PRN

## 2014-12-12 MED ORDER — SODIUM BICARBONATE 8.4 % IV SOLN
50.0000 meq | Freq: Once | INTRAVENOUS | Status: AC
  Administered 2014-12-12: 50 meq via INTRAVENOUS
  Filled 2014-12-12: qty 50

## 2014-12-12 MED ORDER — HYDROMORPHONE HCL 1 MG/ML IJ SOLN
INTRAMUSCULAR | Status: AC
  Filled 2014-12-12: qty 1

## 2014-12-12 MED ORDER — HYDROCODONE-ACETAMINOPHEN 5-325 MG PO TABS
1.0000 | ORAL_TABLET | ORAL | Status: DC | PRN
  Administered 2014-12-12 – 2014-12-13 (×4): 1 via ORAL
  Filled 2014-12-12 (×4): qty 1

## 2014-12-12 MED ORDER — HYDROMORPHONE HCL 1 MG/ML IJ SOLN
0.5000 mg | Freq: Once | INTRAMUSCULAR | Status: AC
  Administered 2014-12-12: 0.5 mg via INTRAVENOUS

## 2014-12-12 MED ORDER — HEPARIN SODIUM (PORCINE) 5000 UNIT/ML IJ SOLN
5000.0000 [IU] | Freq: Three times a day (TID) | INTRAMUSCULAR | Status: DC
  Administered 2014-12-12 – 2014-12-13 (×4): 5000 [IU] via SUBCUTANEOUS
  Filled 2014-12-12 (×7): qty 1

## 2014-12-12 MED ORDER — SODIUM CHLORIDE 0.9 % IV SOLN: 100.0000 mL | INTRAVENOUS | Status: DC | PRN

## 2014-12-12 MED ORDER — SODIUM CHLORIDE 0.9 % IJ SOLN
3.0000 mL | Freq: Two times a day (BID) | INTRAMUSCULAR | Status: DC
  Administered 2014-12-12 – 2014-12-14 (×3): 3 mL via INTRAVENOUS

## 2014-12-12 MED ORDER — SODIUM CHLORIDE 0.9 % IJ SOLN: 3.0000 mL | Freq: Two times a day (BID) | INTRAMUSCULAR | Status: DC

## 2014-12-12 MED ORDER — INSULIN ASPART 100 UNIT/ML ~~LOC~~ SOLN
5.0000 [IU] | Freq: Once | SUBCUTANEOUS | Status: AC
  Administered 2014-12-12: 5 [IU] via INTRAVENOUS
  Filled 2014-12-12: qty 1

## 2014-12-12 MED ORDER — SODIUM CHLORIDE 0.9 % IJ SOLN: 3.0000 mL | INTRAMUSCULAR | Status: DC | PRN

## 2014-12-12 MED ORDER — DIPHENHYDRAMINE HCL 25 MG PO CAPS: 25.0000 mg | ORAL_CAPSULE | Freq: Four times a day (QID) | ORAL | Status: DC | PRN

## 2014-12-12 MED ORDER — HEPARIN SODIUM (PORCINE) 1000 UNIT/ML DIALYSIS: 20.0000 [IU]/kg | INTRAMUSCULAR | Status: DC | PRN

## 2014-12-12 MED ORDER — GABAPENTIN 100 MG PO CAPS
100.0000 mg | ORAL_CAPSULE | Freq: Three times a day (TID) | ORAL | Status: DC
  Administered 2014-12-12 – 2014-12-14 (×5): 100 mg via ORAL
  Filled 2014-12-12 (×7): qty 1

## 2014-12-12 MED ORDER — RENA-VITE PO TABS
1.0000 | ORAL_TABLET | Freq: Every day | ORAL | Status: DC
  Administered 2014-12-12 – 2014-12-13 (×2): 1 via ORAL
  Filled 2014-12-12 (×4): qty 1

## 2014-12-12 MED ORDER — CALCIUM ACETATE (PHOS BINDER) 667 MG PO CAPS
1334.0000 mg | ORAL_CAPSULE | Freq: Three times a day (TID) | ORAL | Status: DC
  Administered 2014-12-13 – 2014-12-14 (×4): 1334 mg via ORAL
  Filled 2014-12-12 (×7): qty 2

## 2014-12-12 MED ORDER — SODIUM CHLORIDE 0.9 % IV SOLN
1.0000 g | Freq: Once | INTRAVENOUS | Status: DC
  Filled 2014-12-12: qty 10

## 2014-12-12 MED ORDER — SODIUM CHLORIDE 0.9 % IV SOLN: 250.0000 mL | INTRAVENOUS | Status: DC | PRN

## 2014-12-12 MED ORDER — ALTEPLASE 2 MG IJ SOLR: 2.0000 mg | Freq: Once | INTRAMUSCULAR | Status: AC | PRN

## 2014-12-12 MED ORDER — ONDANSETRON HCL 4 MG PO TABS: 4.0000 mg | ORAL_TABLET | Freq: Four times a day (QID) | ORAL | Status: DC | PRN

## 2014-12-12 MED ORDER — DEXTROSE 50 % IV SOLN
1.0000 | Freq: Once | INTRAVENOUS | Status: AC
  Administered 2014-12-12: 50 mL via INTRAVENOUS
  Filled 2014-12-12: qty 50

## 2014-12-12 MED ORDER — AMLODIPINE BESYLATE 10 MG PO TABS
10.0000 mg | ORAL_TABLET | Freq: Every day | ORAL | Status: DC
  Filled 2014-12-12: qty 1

## 2014-12-12 MED ORDER — HEPARIN SODIUM (PORCINE) 1000 UNIT/ML DIALYSIS: 1000.0000 [IU] | INTRAMUSCULAR | Status: DC | PRN

## 2014-12-12 MED ORDER — FUROSEMIDE 20 MG PO TABS
20.0000 mg | ORAL_TABLET | Freq: Two times a day (BID) | ORAL | Status: DC
  Filled 2014-12-12: qty 1

## 2014-12-12 MED ORDER — MORPHINE SULFATE 2 MG/ML IJ SOLN: 2.0000 mg | Freq: Once | INTRAMUSCULAR | Status: DC

## 2014-12-12 MED ORDER — DOXERCALCIFEROL 4 MCG/2ML IV SOLN
8.0000 ug | INTRAVENOUS | Status: DC
  Administered 2014-12-13: 8 ug via INTRAVENOUS
  Filled 2014-12-12: qty 4

## 2014-12-12 MED ORDER — LIDOCAINE HCL (PF) 1 % IJ SOLN: 5.0000 mL | INTRAMUSCULAR | Status: DC | PRN

## 2014-12-12 MED ORDER — FUROSEMIDE 20 MG PO TABS
20.0000 mg | ORAL_TABLET | Freq: Two times a day (BID) | ORAL | Status: DC
  Administered 2014-12-12: 20 mg via ORAL
  Filled 2014-12-12 (×5): qty 1

## 2014-12-12 MED ORDER — ALBUTEROL (5 MG/ML) CONTINUOUS INHALATION SOLN
10.0000 mg/h | INHALATION_SOLUTION | RESPIRATORY_TRACT | Status: DC
  Administered 2014-12-12: 10 mg/h via RESPIRATORY_TRACT
  Filled 2014-12-12: qty 20

## 2014-12-12 MED ORDER — SIMVASTATIN 40 MG PO TABS
40.0000 mg | ORAL_TABLET | Freq: Every evening | ORAL | Status: DC
  Filled 2014-12-12: qty 1

## 2014-12-12 MED ORDER — NEPRO/CARBSTEADY PO LIQD: 237.0000 mL | ORAL | Status: DC | PRN

## 2014-12-12 MED ORDER — INSULIN GLARGINE 100 UNIT/ML ~~LOC~~ SOLN
30.0000 [IU] | Freq: Two times a day (BID) | SUBCUTANEOUS | Status: DC
  Administered 2014-12-12 – 2014-12-13 (×3): 30 [IU] via SUBCUTANEOUS
  Filled 2014-12-12 (×5): qty 0.3

## 2014-12-12 NOTE — ED Notes (Signed)
Dialysis ready for pt at this time; report given; no questions.

## 2014-12-12 NOTE — ED Notes (Signed)
Pt states that he has had SOB, leg swelling since Saturday. Pt noted to be diaphoretic. Pt states that his last dialysis treatment was Friday. Pt states that he did not go yesterday and was out of town.

## 2014-12-12 NOTE — ED Notes (Signed)
Attempted to draw labs

## 2014-12-12 NOTE — ED Notes (Signed)
MD at bedside. 

## 2014-12-12 NOTE — ED Notes (Signed)
Lab results reported to Dr. Rees 

## 2014-12-12 NOTE — ED Notes (Signed)
IV team at bedside 

## 2014-12-12 NOTE — Consult Note (Signed)
[facility] Renal Consultation Note  Indication for Consultation:  Management of ESRD/hemodialysis; anemia, hypertension/volume and secondary hyperparathyroidism  HPI: Troy Buchanan is a 44 y.o. male presented in ER co SOB, Chest pain , Aching all over. In ER K 7.7/ CXR= no chf noted. Hemodialysis at [facility] MWF,missed yesterday sec "out of town''. Last HD was Friday 12/08/14. He reports diet noncompliance while out of town and some nausea, sweats, rhinorrhea ,genaral body aches since Friday after HD. Denies adb. Pain or diarrhea, dizziness, or cough. Today while driving back from out of town "took closest exist of interstate with Hospital sign.".Per charge RN at his kidney center he has history of missing some treatments at his center. ESRD presumed sec to HTN /DM on HD past  Year  Per pt history / using R Arm AVF with no access problems .  Currently on hd appears stable with out distress.  He is pretty edematous, says he feels bad, asking for pain meds.  On HD emergently for k of 7.7     Past Medical History  Diagnosis Date  . Hypertension   . Diabetes mellitus   . Hyperlipidemia   . Chronic kidney disease   . CHF (congestive heart failure)     Past Surgical History  Procedure Laterality Date  . Amputation  Left 2009, Right 2012  . Buttock abscess  2000     No family history on file. SOCIAL= He lives with sister and 2 children in [place]  And    reports that he has never smoked. He has never used smokeless tobacco. He reports that he does not drink alcohol or use illicit drugs.   Allergies  Allergen Reactions  . Morphine And Related Nausea Only  . Sulfa Antibiotics Other (See Comments)    swelling    Prior to Admission medications   Medication Sig Start Date End Date Taking? Authorizing Provider  amLODipine (NORVASC) 10 MG tablet Take 10 mg by mouth daily.   Yes Historical Provider, MD  furosemide (LASIX) 20 MG tablet Take 20 mg by mouth 2 (two)  times daily.   Yes Historical Provider, MD  gabapentin (NEURONTIN) 100 MG capsule Take 1 capsule (100 mg total) by mouth 3 (three) times daily. 05/12/12 12/12/14 Yes Amber Fidel Levy, MD  hydrALAZINE (APRESOLINE) 100 MG tablet Take 100 mg by mouth 2 (two) times daily.   Yes Historical Provider, MD  insulin aspart (NOVOLOG) 100 UNIT/ML injection Inject 15 Units into the skin 3 (three) times daily before meals. Sliding scale   Yes Historical Provider, MD  insulin glargine (LANTUS) 100 UNIT/ML injection Inject 40 Units into the skin 2 (two) times daily.   Yes Historical Provider, MD  simvastatin (ZOCOR) 40 MG tablet Take 40 mg by mouth every evening.   Yes Historical Provider, MD     Anti-infectives    None      Results for orders placed or performed during the hospital encounter of 12/12/14 (from the past 48 hour(s))  I-stat troponin, ED (not at [facility])     Status: None   Collection Time: 12/12/14 10:11 AM  Result Value Ref Range   Troponin i, poc 0.05 0.00 - 0.08 ng/mL   Comment 3            Comment: Due to the release kinetics of cTnI, a negative result within the first hours of the onset of symptoms does not rule out myocardial infarction with certainty. If myocardial infarction is still suspected, repeat  the test at appropriate intervals.   I-stat Chem 8, ED     Status: Abnormal   Collection Time: 12/12/14 10:11 AM  Result Value Ref Range   Sodium 135 135 - 145 mmol/L   Potassium 7.7 (HH) 3.5 - 5.1 mmol/L   Chloride 104 96 - 112 mmol/L   BUN 86 (H) 6 - 23 mg/dL   Creatinine, Ser 15.60 (H) 0.50 - 1.35 mg/dL   Glucose, Bld 295 (H) 70 - 99 mg/dL   Calcium, Ion 1.07 (L) 1.12 - 1.23 mmol/L   TCO2 19 0 - 100 mmol/L   Hemoglobin 12.9 (L) 13.0 - 17.0 g/dL   HCT 38.0 (L) 39.0 - 52.0 %   Comment NOTIFIED PHYSICIAN   CBG monitoring, ED     Status: Abnormal   Collection Time: 12/12/14 10:14 AM  Result Value Ref Range   Glucose-Capillary 255 (H) 70 - 99 mg/dL  CBC     Status: Abnormal    Collection Time: 12/12/14 10:15 AM  Result Value Ref Range   WBC 7.5 4.0 - 10.5 K/uL   RBC 3.72 (L) 4.22 - 5.81 MIL/uL   Hemoglobin 10.2 (L) 13.0 - 17.0 g/dL    Comment: RESULT REPEATED AND VERIFIED   HCT 32.0 (L) 39.0 - 52.0 %   MCV 86.0 78.0 - 100.0 fL   MCH 27.4 26.0 - 34.0 pg   MCHC 31.9 30.0 - 36.0 g/dL   RDW 14.7 11.5 - 15.5 %   Platelets 194 150 - 400 K/uL  Basic metabolic panel     Status: Abnormal   Collection Time: 12/12/14 10:15 AM  Result Value Ref Range   Sodium 135 135 - 145 mmol/L   Potassium >7.5 (HH) 3.5 - 5.1 mmol/L    Comment: REPEATED TO VERIFY NO VISIBLE HEMOLYSIS CRITICAL RESULT CALLED TO, READ BACK BY AND VERIFIED WITH: V.THOMPSON,RN 12/12/14 1123 BY BSLADE    Chloride 101 96 - 112 mmol/L   CO2 21 19 - 32 mmol/L   Glucose, Bld 291 (H) 70 - 99 mg/dL   BUN 91 (H) 6 - 23 mg/dL   Creatinine, Ser 16.48 (H) 0.50 - 1.35 mg/dL   Calcium 8.9 8.4 - 10.5 mg/dL   GFR calc non Af Amer 3 (L) >90 mL/min   GFR calc Af Amer 4 (L) >90 mL/min    Comment: (NOTE) The eGFR has been calculated using the CKD EPI equation. This calculation has not been validated in all clinical situations. eGFR's persistently <90 mL/min signify possible Chronic Kidney Disease.    Anion gap 13 5 - 15  BNP (order ONLY if patient complains of dyspnea/SOB AND you have documented it for THIS visit)     Status: Abnormal   Collection Time: 12/12/14 10:15 AM  Result Value Ref Range   B Natriuretic Peptide 687.2 (H) 0.0 - 100.0 pg/mL     ROS: see hpi for positives   Physical Exam: Filed Vitals:   12/12/14 1130  BP: 160/82  Pulse: 62  Temp:   Resp:      General: Alert  BM , obese, alert OX3, appropriate  HEENT: [place], MMM, Nonicteric Neck: mild jvd Heart: RRR soft 1/6 sem LSB , no rub or gallop/ bilat Gynecomastia Lungs: BS  bilat  Decr in bases and some fine rales Abdomen: obese, bs pos soft , NT, ND Extremities: 2 +- 3  bipedal edema Skin: No overt rash, warm dry Neuro: Alert ox3  moves all etrem, no focal  Gross deficits noted  Dialysis Access: R FA AVF patent on HD  Dialysis Orders: Center:  [facility] on MWF . EDW 222 lbs =101.0 kg  HD Bath 2.0 k , 2.5 ca  Time  Heparin 6000  4 hours. Access R FA AVF BFR 400 DFR 600    Hectorol 8 mcg IV/HD Epogen 1,200   Units IV/HD  Venofer  0   Assessment/Plan 1. Hyperkalemia/ Volume overload  sec missed HD and Diet Indiscretion = hd now.  I feel like he could benefit from HD tomorrow as well.  According to our weights is 13.9 kg above his EDW !! 2. ESRD -  OP  HD  Followed by [facility] MWF - hd to with ^ k and in tomor to keep on schedule   3. Hypertension/volume  -  bp 160/82 uf today / vol. ^ by exam and CXR showing No CHF/ home bp meds  uf on hd 4. 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 Nutrition - renal/carb diet and renal vitamin 7. DM type 2 per admit  Ernest Haber, PA-C [place] [phone] 12/12/2014, 12:13 PM   Patient seen and examined, agree with above note with above modifications. ESRD patient from [facility], last HD was Friday, now presents 13.9 kg above his EDW and with a dangerously high K- doing HD emergently today, will plan for extra treatment tomorrow as well.  If doing OK tomorrow possible discharge unless primary team finds any other reason for him to stay.   Corliss Parish, MD 12/12/2014

## 2014-12-12 NOTE — ED Notes (Signed)
[facility] in communication with EDP; notified need for bed request and orders; pt to go to dialysis.

## 2014-12-12 NOTE — ED Notes (Signed)
3 attempts at IV start unsuccessful.

## 2014-12-12 NOTE — Procedures (Signed)
Patient was seen on dialysis and the procedure was supervised.  BFR 400  Via AVF BP is  135/82.   Patient appears to be tolerating treatment well- had to be done emergently for K of 7.7- also appears to be very volume overloaded- will likely need another treatment tomorrow   Darleene Cumpian A 12/12/2014

## 2014-12-12 NOTE — ED Provider Notes (Signed)
CSN: [id number]     Arrival date & time 12/12/14  0754 History   First MD Initiated Contact with Patient 12/12/14 1002     Chief Complaint  Patient presents with  . Shortness of Breath  . Leg Swelling      Patient is a 44 y.o. male presenting with shortness of breath. The history is provided by the patient. No language interpreter was used.  Shortness of Breath  [name] presents for evaluation of feeling bad, SOB, leg swelling.  Sxs started this weekend.  He has ESRD and is on dialysis MWF.  His last dialysis was Friday.  He missed dialysis yesterday because he was out of town.  He reports generalized body aches and cramping, SOB, leg swelling, mild chest discomfort.  Sxs have been gradual in onset over the last few days.  He denies fevers, coughing, diarrhea.  He has mild vomiting. He has felt this way previously from missing dialysis.  Sxs are moderate to severe, constant, worsening.  [place]  Past Medical History  Diagnosis Date  . Hypertension   . Diabetes mellitus   . Hyperlipidemia   . Chronic kidney disease   . CHF (congestive heart failure)    Past Surgical History  Procedure Laterality Date  . Amputation  Left 2009, Right 2012  . Buttock abscess  2000   No family history on file. History  Substance Use Topics  . Smoking status: Never Smoker   . Smokeless tobacco: Never Used  . Alcohol Use: No    Review of Systems  Respiratory: Positive for shortness of breath.   All other systems reviewed and are negative.     Allergies  Morphine and related and Sulfa antibiotics  Home Medications   Prior to Admission medications   Medication Sig Start Date End Date Taking? Authorizing Provider  amLODipine (NORVASC) 10 MG tablet Take 10 mg by mouth daily.   Yes Historical Provider, MD  furosemide (LASIX) 20 MG tablet Take 20 mg by mouth 2 (two) times daily.   Yes Historical Provider, MD  gabapentin (NEURONTIN) 100 MG capsule Take 1 capsule  (100 mg total) by mouth 3 (three) times daily. 05/12/12 12/12/14 Yes Amber Nydia BoutonM [place], MD  hydrALAZINE (APRESOLINE) 100 MG tablet Take 100 mg by mouth 2 (two) times daily.   Yes Historical Provider, MD  insulin aspart (NOVOLOG) 100 UNIT/ML injection Inject 15 Units into the skin 3 (three) times daily before meals. Sliding scale   Yes Historical Provider, MD  insulin glargine (LANTUS) 100 UNIT/ML injection Inject 40 Units into the skin 2 (two) times daily.   Yes Historical Provider, MD  simvastatin (ZOCOR) 40 MG tablet Take 40 mg by mouth every evening.   Yes Historical Provider, MD   BP 131/78 mmHg  Pulse 58  Temp(Src) 97.6 F (36.4 C) (Oral)  Resp 9  Ht 5\' 11"  (1.803 m)  SpO2 93% Physical Exam  Constitutional: He is oriented to person, place, and time. He appears well-developed and well-nourished. He appears distressed.  HENT:  Head: Normocephalic and atraumatic.  Cardiovascular: Normal rate and regular rhythm.   No murmur heard. Pulmonary/Chest: Effort normal. No respiratory distress.  Decreased breath sounds in bilateral bases  Abdominal: Soft. There is no tenderness. There is no rebound and no guarding.  Musculoskeletal: He exhibits no tenderness.  2+ pitting edema in BLE, fistula and right upper extremity with palpable thrill.  Neurological: He is alert and oriented to person, place, and time.  Skin:  Skin is warm.  diaphoretic  Psychiatric: He has a normal mood and affect. His behavior is normal.  Nursing note and vitals reviewed.   ED Course  Procedures (including critical care time) CRITICAL CARE Performed by: Tilden Fossa   Total critical care time: 30 minutes  Critical care time was exclusive of separately billable procedures and treating other patients.  Critical care was necessary to treat or prevent imminent or life-threatening deterioration.  Critical care was time spent personally by me on the following activities: development of treatment plan with patient  and/or surrogate as well as nursing, discussions with consultants, evaluation of patient's response to treatment, examination of patient, obtaining history from patient or surrogate, ordering and performing treatments and interventions, ordering and review of laboratory studies, ordering and review of radiographic studies, pulse oximetry and re-evaluation of patient's condition.  Labs Review Labs Reviewed  I-STAT CHEM 8, ED - Abnormal; Notable for the following:    Potassium 7.7 (*)    BUN 86 (*)    Creatinine, Ser 15.60 (*)    Glucose, Bld 295 (*)    Calcium, Ion 1.07 (*)    Hemoglobin 12.9 (*)    HCT 38.0 (*)    All other components within normal limits  CBG MONITORING, ED - Abnormal; Notable for the following:    Glucose-Capillary 255 (*)    All other components within normal limits  CBC  BASIC METABOLIC PANEL  BRAIN NATRIURETIC PEPTIDE  I-STAT TROPOININ, ED    Imaging Review Dg Chest 2 View  12/12/2014   CLINICAL DATA:  Three-day history of shortness of breath and bilateral lower extremity edema. Chronic renal failure.  EXAM: CHEST  2 VIEW  COMPARISON:  December 02, 2014  FINDINGS: The degree of inspiration is shallow. There is no edema or consolidation. Heart is upper normal in size with pulmonary vascularity within normal limits. No adenopathy. No bone lesions.  IMPRESSION: No edema or consolidation.   Electronically Signed   By: Bretta Bang III M.D.   On: 12/12/2014 09:08     EKG Interpretation   Date/Time:  Tuesday December 12 2014 08:10:02 EDT Ventricular Rate:  63 PR Interval:  192 QRS Duration: 88 QT Interval:  412 QTC Calculation: 421 R Axis:   53 Text Interpretation:  Normal sinus rhythm Cannot rule out Anterior infarct  , age undetermined Nonspecific ST and T wave abnormality Abnormal ECG  Confirmed by Lincoln Brigham [phone]) on 12/12/2014 9:47:06 AM      MDM   Final diagnoses:  Acute hyperkalemia  End stage renal disease on dialysis    Patient with end-stage  renal disease on hemodialysis here following missed dialysis session. Patient is ill-appearing on exam with acute hyperkalemia to 7.7. EKG does not have any QRS widening at this time. Patient is treated for hyperkalemia with temporizing measures. Discussed the case with Dr. Lacy Duverney with nephrology who will see the patient for dialysis. Discussed with hospitalist regarding observation admission to evaluate for improvement in symptoms.    Tilden Fossa, MD 12/12/14 1044

## 2014-12-12 NOTE — ED Notes (Signed)
Pt is diaphoretic RR 25; reports "feels unwell"; has happened to him before when misses dialysis.

## 2014-12-12 NOTE — H&P (Signed)
[facility] History and Physical                                                                                    Troy Buchanan, is a 44 y.o. male  MRN: [id number]   DOB - Jul 25, 1971  Admit Date - 12/12/2014  Outpatient Primary MD for the patient is Zoila Shutter, MD  With History of -  Past Medical History  Diagnosis Date  . Hypertension   . Diabetes mellitus   . Hyperlipidemia   . Chronic kidney disease   . CHF (congestive heart failure)       Past Surgical History  Procedure Laterality Date  . Amputation  Left 2009, Right 2012  . Buttock abscess  2000    in for   Chief Complaint  Patient presents with  . Shortness of Breath  . Leg Swelling     HPI  Troy Buchanan  is a 44 y.o. male, with a pmh of DM, ESRD, CHF, HTN who normally dialyzes in [place] on M/W/F.  He presented to the ER with K of 7.7, diaphoresis, tremors having missed HD on Monday.  The patient was seen in HD.  He reports developing sluggishness and arthralgias on 3/11, Saturday he developed chills and tremoring.  On Sunday he had no appetite and began vomiting food (no hematemesis).  He reports missing HD on Monday because he traveled to [facility].  On exam he complains of upper pain across his chest and severe cramping.  He requests pain medications.  "Its the one that starts with a D".    CXR is clear, POC troponin was normal.   Review of Systems   In addition to the HPI above,  ++ Sweats and chills No Headache, No changes with Vision or hearing, No problems swallowing food or Liquids, ++ Chest pain and SOB No diarrhea, +vomiting. No new skin rashes or bruises, ++ Arthalgias and cramping  A full 10 point Review of Systems was done, except as stated above, all other Review of Systems were negative.  Social History History  Substance Use Topics  . Smoking status: Never Smoker   . Smokeless tobacco: Never Used  . Alcohol Use: No    Family History Mother died last year of multiple  myeloma, Father died last year of lung cancer.  Both had diabetes.  Prior to Admission medications   Medication Sig Start Date End Date Taking? Authorizing Provider  amLODipine (NORVASC) 10 MG tablet Take 10 mg by mouth daily.   Yes Historical Provider, MD  furosemide (LASIX) 20 MG tablet Take 20 mg by mouth 2 (two) times daily.   Yes Historical Provider, MD  gabapentin (NEURONTIN) 100 MG capsule Take 1 capsule (100 mg total) by mouth 3 (three) times daily. 05/12/12 12/12/14 Yes Amber Nydia Bouton, MD  hydrALAZINE (APRESOLINE) 100 MG tablet Take 100 mg by mouth 2 (two) times daily.   Yes Historical Provider, MD  insulin aspart (NOVOLOG) 100 UNIT/ML injection Inject 15 Units into the skin 3 (three) times daily before meals. Sliding scale   Yes Historical Provider, MD  insulin glargine (LANTUS) 100 UNIT/ML injection Inject 40 Units into the skin 2 (  two) times daily.   Yes Historical Provider, MD  simvastatin (ZOCOR) 40 MG tablet Take 40 mg by mouth every evening.   Yes Historical Provider, MD    Allergies  Allergen Reactions  . Morphine And Related Nausea Only  . Sulfa Antibiotics Other (See Comments)    swelling    Physical Exam  Vitals  Blood pressure 160/82, pulse 62, temperature 97.7 F (36.5 C), temperature source Oral, resp. rate 14, height 5\' 11"  (1.803 m), weight 114.9 kg (253 lb 4.9 oz), SpO2 94 %.   General:  Obese, diaphoretic, AA male, lying in bed in NAD, A&O, gynecomastica  Psych:  Normal affect and insight, Not Suicidal or Homicidal, Awake Alert, Cooperative  Neuro:   No F.N deficits, Right eye is weak and wanes right, Strength 5/5 all 4 extremities, Sensation intact all 4 extremities.  ENT:  Ears and Eyes appear Normal, Conjunctivae clear, PER. Moist oral mucosa without erythema or exudates. Eyelids mildly swollen  Neck:  Supple, No lymphadenopathy appreciated  Respiratory:  Bilateral diffuse rales, mildly increased work of breathing.  Cardiac:  Difficult to hear,  but no frank rubs or gallops.   1+ LE Edema, non pitting.  Abdomen:  Obese, Positive bowel sounds, Soft, Non tender, Non distended,  No masses appreciated  Skin:  No Cyanosis, Normal Skin Turgor, No Skin Rash or Bruise.  Extremities:  Able to move all 4. 5/5 strength in each,  no effusions.  Data Review  CBC  Recent Labs Lab 12/12/14 1011 12/12/14 1015  WBC  --  7.5  HGB 12.9* 10.2*  HCT 38.0* 32.0*  PLT  --  194  MCV  --  86.0  MCH  --  27.4  MCHC  --  31.9  RDW  --  14.7    Chemistries   Recent Labs Lab 12/12/14 1011 12/12/14 1015  NA 135 135  K 7.7* >7.5*  CL 104 101  CO2  --  21  GLUCOSE 295* 291*  BUN 86* 91*  CREATININE 15.60* 16.48*  CALCIUM  --  8.9    Imaging results:   Dg Chest 2 View  12/12/2014   CLINICAL DATA:  Three-day history of shortness of breath and bilateral lower extremity edema. Chronic renal failure.  EXAM: CHEST  2 VIEW  COMPARISON:  December 02, 2014  FINDINGS: The degree of inspiration is shallow. There is no edema or consolidation. Heart is upper normal in size with pulmonary vascularity within normal limits. No adenopathy. No bone lesions.  IMPRESSION: No edema or consolidation.   Electronically Signed   By: Bretta BangWilliam  Woodruff III M.D.   On: 12/12/2014 09:08    My personal review of EKG: NSR, No ST changes noted.   Assessment & Plan  Active Problems:   Hyperkalemia   CHF (congestive heart failure)   Hypertension   ESRD (end stage renal disease)   Hyperkalemia (7.7) Calcium gluconate given in ED Went to emergent HD.   Recheck Renal Panel in am. No peaked T waves on EKG. Patient appeared so unstable at the time of admission that we will request admission for observation in a step down bed.  Arthralgias/vomiting Check for Influenza. Droplet precautions. Supportive care.  HTN Controlled with HD.  Continue hydalazine, amlodipine.  HLD Continue Zocor.   DVT Prophylaxis: Heparin  AM Labs Ordered, also please review Full  Orders  Family Communication:   None at bedside (patient A&O)  Code Status:  Full  Condition:  Unstable.  Time spent in minutes :  [address]  PA-C on 12/12/2014 at 12:05 PM  Between 7am to 7pm - Pager - [phone]  After 7pm go to [email or web address] - password [email or web address]  And look for the night coverage person covering me after hours  [facility]

## 2014-12-13 DIAGNOSIS — E669 Obesity, unspecified: Secondary | ICD-10-CM | POA: Diagnosis present

## 2014-12-13 DIAGNOSIS — I10 Essential (primary) hypertension: Secondary | ICD-10-CM | POA: Diagnosis not present

## 2014-12-13 DIAGNOSIS — Z9119 Patient's noncompliance with other medical treatment and regimen: Secondary | ICD-10-CM | POA: Diagnosis present

## 2014-12-13 DIAGNOSIS — I120 Hypertensive chronic kidney disease with stage 5 chronic kidney disease or end stage renal disease: Secondary | ICD-10-CM | POA: Diagnosis present

## 2014-12-13 DIAGNOSIS — I5032 Chronic diastolic (congestive) heart failure: Secondary | ICD-10-CM

## 2014-12-13 DIAGNOSIS — Z992 Dependence on renal dialysis: Secondary | ICD-10-CM | POA: Diagnosis not present

## 2014-12-13 DIAGNOSIS — N186 End stage renal disease: Secondary | ICD-10-CM | POA: Diagnosis not present

## 2014-12-13 DIAGNOSIS — Z885 Allergy status to narcotic agent status: Secondary | ICD-10-CM | POA: Diagnosis not present

## 2014-12-13 DIAGNOSIS — Z794 Long term (current) use of insulin: Secondary | ICD-10-CM | POA: Diagnosis not present

## 2014-12-13 DIAGNOSIS — G9349 Other encephalopathy: Secondary | ICD-10-CM | POA: Diagnosis present

## 2014-12-13 DIAGNOSIS — E875 Hyperkalemia: Secondary | ICD-10-CM | POA: Diagnosis not present

## 2014-12-13 DIAGNOSIS — R0602 Shortness of breath: Secondary | ICD-10-CM | POA: Diagnosis present

## 2014-12-13 DIAGNOSIS — Z882 Allergy status to sulfonamides status: Secondary | ICD-10-CM | POA: Diagnosis not present

## 2014-12-13 DIAGNOSIS — D649 Anemia, unspecified: Secondary | ICD-10-CM | POA: Diagnosis present

## 2014-12-13 DIAGNOSIS — E119 Type 2 diabetes mellitus without complications: Secondary | ICD-10-CM | POA: Diagnosis present

## 2014-12-13 DIAGNOSIS — E1121 Type 2 diabetes mellitus with diabetic nephropathy: Secondary | ICD-10-CM | POA: Diagnosis not present

## 2014-12-13 DIAGNOSIS — I509 Heart failure, unspecified: Secondary | ICD-10-CM | POA: Diagnosis present

## 2014-12-13 DIAGNOSIS — E785 Hyperlipidemia, unspecified: Secondary | ICD-10-CM | POA: Diagnosis present

## 2014-12-13 LAB — INFLUENZA PANEL BY PCR (TYPE A & B)
H1N1 flu by pcr: NOT DETECTED
INFLAPCR: NEGATIVE
Influenza B By PCR: NEGATIVE

## 2014-12-13 LAB — RENAL FUNCTION PANEL
ANION GAP: 11 (ref 5–15)
Albumin: 3.4 g/dL — ABNORMAL LOW (ref 3.5–5.2)
BUN: 41 mg/dL — AB (ref 6–23)
CO2: 29 mmol/L (ref 19–32)
Calcium: 8.4 mg/dL (ref 8.4–10.5)
Chloride: 97 mmol/L (ref 96–112)
Creatinine, Ser: 10.45 mg/dL — ABNORMAL HIGH (ref 0.50–1.35)
GFR calc Af Amer: 6 mL/min — ABNORMAL LOW (ref >90)
GFR, EST NON AFRICAN AMERICAN: 5 mL/min — AB (ref >90)
GLUCOSE: 269 mg/dL — AB (ref 70–99)
PHOSPHORUS: 7.3 mg/dL — AB (ref 2.3–4.6)
Potassium: 4.7 mmol/L (ref 3.5–5.1)
Sodium: 137 mmol/L (ref 135–145)

## 2014-12-13 LAB — GLUCOSE, CAPILLARY
Glucose-Capillary: 302 mg/dL — ABNORMAL HIGH (ref 70–99)
Glucose-Capillary: 295 mg/dL — ABNORMAL HIGH (ref 70–99)
Glucose-Capillary: 202 mg/dL — ABNORMAL HIGH (ref 70–99)

## 2014-12-13 MED ORDER — HYDROCODONE-ACETAMINOPHEN 5-325 MG PO TABS
2.0000 | ORAL_TABLET | ORAL | Status: DC | PRN
  Administered 2014-12-13 – 2014-12-14 (×2): 2 via ORAL
  Filled 2014-12-13 (×2): qty 2

## 2014-12-13 MED ORDER — DOXERCALCIFEROL 4 MCG/2ML IV SOLN
INTRAVENOUS | Status: AC
  Filled 2014-12-13: qty 4

## 2014-12-13 MED ORDER — ATORVASTATIN CALCIUM 20 MG PO TABS
20.0000 mg | ORAL_TABLET | Freq: Every day | ORAL | Status: DC
  Administered 2014-12-13: 20 mg via ORAL
  Filled 2014-12-13 (×2): qty 1

## 2014-12-13 MED ORDER — AMLODIPINE BESYLATE 10 MG PO TABS
10.0000 mg | ORAL_TABLET | Freq: Every day | ORAL | Status: DC
  Administered 2014-12-13: 10 mg via ORAL
  Filled 2014-12-13 (×2): qty 1

## 2014-12-13 NOTE — Progress Notes (Signed)
Patient for transfer. Report called to Synetta FailAnita, [place]

## 2014-12-13 NOTE — Progress Notes (Signed)
UR completed 

## 2014-12-13 NOTE — Procedures (Signed)
I was present at this session.  I have reviewed the session itself and made appropriate changes.  bp ^, vol xs, access press ok.    Sina Lucchesi L 3/16/20168:36 AM

## 2014-12-13 NOTE — Progress Notes (Signed)
Subjective: Interval History: has complaints aches all over.  Objective: Vital signs in last 24 hours: Temp:  [97.7 F (36.5 C)-98.7 F (37.1 C)] 98.1 F (36.7 C) (03/16 0756) Pulse Rate:  [58-75] 67 (03/16 0815) Resp:  [9-20] 12 (03/16 0815) BP: (126-170)/(44-82) 158/72 mmHg (03/16 0815) SpO2:  [87 %-100 %] 98 % (03/16 0756) Weight:  [106.7 kg (235 lb 3.7 oz)-114.9 kg (253 lb 4.9 oz)] 110.2 kg (242 lb 15.2 oz) (03/16 0756) Weight change:   Intake/Output from previous day: 03/15 0701 - 03/16 0700 In: 3 [I.V.:3] Out: 4500  Intake/Output this shift:    General appearance: alert, cooperative and no distress Resp: rales bibasilar Cardio: S1, S2 normal and systolic murmur: holosystolic 2/6, blowing at apex GI: pos bs, obese, soft,liver down 5 cm Extremities: edema 2+ and AVF RUA  Lab Results:  Recent Labs  12/12/14 1015 12/12/14 1154  WBC 7.5  --   HGB 10.2* 11.9*  HCT 32.0* 35.0*  PLT 194  --    BMET:  Recent Labs  12/12/14 1015 12/12/14 1154 12/13/14 0524  NA 135 139 137  K >7.5* 5.2* 4.7  CL 101 103 97  CO2 21  --  29  GLUCOSE 291* 194* 269*  BUN 91* 58* 41*  CREATININE 16.48* 11.70* 10.45*  CALCIUM 8.9  --  8.4   No results for input(s): PTH in the last 72 hours. Iron Studies: No results for input(s): IRON, TIBC, TRANSFERRIN, FERRITIN in the last 72 hours.  Studies/Results: Dg Chest 2 View  12/12/2014   CLINICAL DATA:  Three-day history of shortness of breath and bilateral lower extremity edema. Chronic renal failure.  EXAM: CHEST  2 VIEW  COMPARISON:  December 02, 2014  FINDINGS: The degree of inspiration is shallow. There is no edema or consolidation. Heart is upper normal in size with pulmonary vascularity within normal limits. No adenopathy. No bone lesions.  IMPRESSION: No edema or consolidation.   Electronically Signed   By: Bretta BangWilliam  Woodruff III M.D.   On: 12/12/2014 09:08    I have reviewed the patient's current medications.  Assessment/Plan: 1  ESRD vol xs. 2nd HD .  2 Anemia ok. 3 HPTH on vit D 4 DM controlle 5 ? Influ 6 Nonadherence 7 HTN lower vol , hs meds P HD, vol off, DM control        [name] 12/13/2014,8:39 AM

## 2014-12-13 NOTE — Progress Notes (Signed)
Transferred to floor

## 2014-12-13 NOTE — Progress Notes (Signed)
[facility] PROGRESS NOTE  Troy Buchanan ZOX:[id number] DOB: June 29, 1971 DOA: 12/12/2014 PCP: Zoila ShutterWOODYEAR,[name], MD Interim summary: 44 y.o male with h/o ESRD on HD, CHF, hypertension, DM, coming in after missing HD.  Assessment/Plan: Volume overload and hyperkalemia: From missing his HD and non compliant to diet . He had 2 sessions of HD and his potassium is normal today.   ESRD on HD: Recommend compliance to HD treatements.   Hypertension: No signs of heart failure or hypertensive crisis.   Anemia: Appears to be stable. Recommend checking another hemoglobin today.    Diabetes mellitus: , CBG (last 3)   Recent Labs  12/12/14 1014 12/12/14 1803 12/12/14 2206  GLUCAP 255* 259* 215*   Increase Lantus to 24 units and continue with SSI.   Chills and body aches; Influenza PCR is negative. Symptoms probably from missing his HD.   Generalized body aches: On vicodin.    Code Status: FULL CODE Family Communication: none at bedside Disposition Plan: plan for d/c in am.   Consultants:  renal  Procedures:  HD  Antibiotics:  NONE  HPI/Subjective: genralized body aches, chills. Headache. Nausea is better , no vomiting or abdominal pain.   Objective: Filed Vitals:   12/13/14 1030  BP: 134/71  Pulse: 66  Temp:   Resp: 12    Intake/Output Summary (Last 24 hours) at 12/13/14 1105 Last data filed at 12/12/14 2253  Gross per 24 hour  Intake      3 ml  Output   4500 ml  Net  -4497 ml   Filed Weights   12/12/14 1506 12/13/14 0500 12/13/14 0756  Weight: 110.1 kg (242 lb 11.6 oz) 106.7 kg (235 lb 3.7 oz) 110.2 kg (242 lb 15.2 oz)    Exam:   General:  Alert afebrile , not in any distress  Cardiovascular: s1s2  Respiratory: clear to auscultation, no wheezing or rhonchi  Abdomen: soft non tender non distended bowel sounds heard  Musculoskeletal: trace  pedal edema.   Data Reviewed: Basic Metabolic Panel:  Recent Labs Lab 12/12/14 1011  12/12/14 1015 12/12/14 1154 12/13/14 0524  NA 135 135 139 137  K 7.7* >7.5* 5.2* 4.7  CL 104 101 103 97  CO2  --  21  --  29  GLUCOSE 295* 291* 194* 269*  BUN 86* 91* 58* 41*  CREATININE 15.60* 16.48* 11.70* 10.45*  CALCIUM  --  8.9  --  8.4  PHOS  --   --   --  7.3*   Liver Function Tests:  Recent Labs Lab 12/13/14 0524  ALBUMIN 3.4*   No results for input(s): LIPASE, AMYLASE in the last 168 hours. No results for input(s): AMMONIA in the last 168 hours. CBC:  Recent Labs Lab 12/12/14 1011 12/12/14 1015 12/12/14 1154  WBC  --  7.5  --   HGB 12.9* 10.2* 11.9*  HCT 38.0* 32.0* 35.0*  MCV  --  86.0  --   PLT  --  194  --    Cardiac Enzymes: No results for input(s): CKTOTAL, CKMB, CKMBINDEX, TROPONINI in the last 168 hours. BNP (last 3 results)  Recent Labs  12/12/14 1015  BNP 687.2*    ProBNP (last 3 results) No results for input(s): PROBNP in the last 8760 hours.  CBG:  Recent Labs Lab 12/12/14 1014 12/12/14 1803 12/12/14 2206  GLUCAP 255* 259* 215*    Recent Results (from the past 240 hour(s))  MRSA PCR Screening     Status: None   Collection  Time: 12/12/14  6:20 PM  Result Value Ref Range Status   MRSA by PCR NEGATIVE NEGATIVE Final    Comment:        The GeneXpert MRSA Assay (FDA approved for NASAL specimens only), is one component of a comprehensive MRSA colonization surveillance program. It is not intended to diagnose MRSA infection nor to guide or monitor treatment for MRSA infections.      Studies: Dg Chest 2 View  12/12/2014   CLINICAL DATA:  Three-day history of shortness of breath and bilateral lower extremity edema. Chronic renal failure.  EXAM: CHEST  2 VIEW  COMPARISON:  December 02, 2014  FINDINGS: The degree of inspiration is shallow. There is no edema or consolidation. Heart is upper normal in size with pulmonary vascularity within normal limits. No adenopathy. No bone lesions.  IMPRESSION: No edema or consolidation.    Electronically Signed   By: Bretta Bang III M.D.   On: 12/12/2014 09:08    Scheduled Meds: . amLODipine  10 mg Oral QHS  . calcium acetate  1,334 mg Oral TID WC  . calcium gluconate 1 GM IV  1 g Intravenous Once  . doxercalciferol  8 mcg Intravenous Q M,W,F-HD  . gabapentin  100 mg Oral TID  . heparin  5,000 Units Subcutaneous 3 times per day  . hydrALAZINE  100 mg Oral BID  . insulin aspart  0-20 Units Subcutaneous TID WC  . insulin glargine  30 Units Subcutaneous BID  . multivitamin  1 tablet Oral QHS  . simvastatin  40 mg Oral QPM  . sodium chloride  3 mL Intravenous Q12H  . sodium chloride  3 mL Intravenous Q12H   Continuous Infusions: . albuterol 10 mg/hr (12/12/14 1026)    Active Problems:   Hyperkalemia   CHF (congestive heart failure)   Hypertension   ESRD (end stage renal disease)   Vomiting   Uremic encephalopathy   Type 2 diabetes mellitus    Time spent: 25 minutes    [facility]  [facility] Pager [phone]. If 7PM-7AM, please contact night-coverage at [email or web address], password [facility] 12/13/2014, 11:05 AM

## 2014-12-14 LAB — CBC
HCT: 34.6 % — ABNORMAL LOW (ref 39.0–52.0)
Hemoglobin: 11.2 g/dL — ABNORMAL LOW (ref 13.0–17.0)
MCH: 27.7 pg (ref 26.0–34.0)
MCHC: 32.4 g/dL (ref 30.0–36.0)
MCV: 85.4 fL (ref 78.0–100.0)
PLATELETS: 183 10*3/uL (ref 150–400)
RBC: 4.05 MIL/uL — ABNORMAL LOW (ref 4.22–5.81)
RDW: 14.6 % (ref 11.5–15.5)
WBC: 6.5 10*3/uL (ref 4.0–10.5)

## 2014-12-14 LAB — GLUCOSE, CAPILLARY
Glucose-Capillary: 406 mg/dL — ABNORMAL HIGH (ref 70–99)
Glucose-Capillary: 134 mg/dL — ABNORMAL HIGH (ref 70–99)

## 2014-12-14 LAB — BASIC METABOLIC PANEL
Anion gap: 15 (ref 5–15)
BUN: 36 mg/dL — AB (ref 6–23)
CO2: 24 mmol/L (ref 19–32)
CREATININE: 8.72 mg/dL — AB (ref 0.50–1.35)
Calcium: 9.4 mg/dL (ref 8.4–10.5)
Chloride: 94 mmol/L — ABNORMAL LOW (ref 96–112)
GFR calc Af Amer: 8 mL/min — ABNORMAL LOW (ref >90)
GFR calc non Af Amer: 7 mL/min — ABNORMAL LOW (ref >90)
GLUCOSE: 368 mg/dL — AB (ref 70–99)
Potassium: 5.1 mmol/L (ref 3.5–5.1)
SODIUM: 133 mmol/L — AB (ref 135–145)

## 2014-12-14 LAB — HEMOGLOBIN A1C
Hgb A1c MFr Bld: 8.2 % — ABNORMAL HIGH (ref 4.8–5.6)
MEAN PLASMA GLUCOSE: 189 mg/dL

## 2014-12-14 MED ORDER — CALCIUM ACETATE (PHOS BINDER) 667 MG PO CAPS: 1334.0000 mg | ORAL_CAPSULE | Freq: Three times a day (TID) | ORAL | Status: AC

## 2014-12-14 MED ORDER — GABAPENTIN 100 MG PO CAPS: 100.0000 mg | ORAL_CAPSULE | Freq: Three times a day (TID) | ORAL | Status: DC

## 2014-12-14 MED ORDER — GABAPENTIN 100 MG PO CAPS: 100.0000 mg | ORAL_CAPSULE | Freq: Every day | ORAL | Status: AC

## 2014-12-14 MED ORDER — NEPRO/CARBSTEADY PO LIQD: 237.0000 mL | ORAL | Status: AC | PRN

## 2014-12-14 MED ORDER — INSULIN ASPART 100 UNIT/ML ~~LOC~~ SOLN
3.0000 [IU] | Freq: Three times a day (TID) | SUBCUTANEOUS | Status: DC
  Administered 2014-12-14: 3 [IU] via SUBCUTANEOUS

## 2014-12-14 MED ORDER — INSULIN ASPART 100 UNIT/ML ~~LOC~~ SOLN: SUBCUTANEOUS | Status: AC

## 2014-12-14 MED ORDER — INSULIN GLARGINE 100 UNIT/ML ~~LOC~~ SOLN
40.0000 [IU] | Freq: Two times a day (BID) | SUBCUTANEOUS | Status: DC
  Administered 2014-12-14: 40 [IU] via SUBCUTANEOUS
  Filled 2014-12-14 (×2): qty 0.4

## 2014-12-14 MED ORDER — OXYCODONE HCL 5 MG PO TABS
10.0000 mg | ORAL_TABLET | Freq: Once | ORAL | Status: AC
  Administered 2014-12-14: 10 mg via ORAL
  Filled 2014-12-14: qty 2

## 2014-12-14 NOTE — Progress Notes (Signed)
Pt discharge instructions and prescriptions given, pt verbalized understanding.  VSS. Denies pain.  Pt left floor via wheelchair accompanied by staff.

## 2014-12-14 NOTE — Discharge Summary (Signed)
Physician Discharge Summary  Troy Buchanan:[id number] DOB: 17-Jan-1971 DOA: 12/12/2014  PCP: Zoila ShutterWOODYEAR,[name], MD  Admit date: 12/12/2014 Discharge date: 12/14/2014  Time spent: 30 minutes  Recommendations for Outpatient Follow-up:  Follow up with PCP i none week.   Discharge Diagnoses:  Active Problems:   Hyperkalemia   CHF (congestive heart failure)   Hypertension   ESRD (end stage renal disease)   Vomiting   Uremic encephalopathy   Type 2 diabetes mellitus   Discharge Condition: improved  Diet recommendation: renal   Filed Weights   12/13/14 1156 12/13/14 1940 12/14/14 0111  Weight: 106.1 kg (233 lb 14.5 oz) 104.146 kg (229 lb 9.6 oz) 104.146 kg (229 lb 9.6 oz)    History of present illness:   44 y.o male with h/o ESRD on HD, CHF, hypertension, DM, coming in after missing HD.  Hospital Course:  Volume overload and hyperkalemia: From missing his HD and non compliant to diet . He had 2 sessions of HD and his potassium is normal today.  Recommend compliance to HD treatments. He denies any sob and chest pain.   ESRD on HD: Recommend compliance to HD treatements.   Hypertension: No signs of heart failure or hypertensive crisis.   Anemia: Appears to be stable. Repeat level is stable.    Diabetes mellitus: , CBG (last 3)   Recent Labs  12/13/14 1731 12/13/14 1938 12/14/14 0811  GLUCAP 295* 202* 406*   Repeat cbg is 135.  Increased lantus to his home dose of 40 units BID and added novolog pre meal coverage.    Chills and body aches; Influenza PCR is negative. Symptoms probably from missing his HD. Resolved.   Generalized body aches: Received few doses of vicodin.         Procedures:  HD yesterday.  Consultations:  renal  Discharge Exam: Filed Vitals:   12/14/14 0446  BP: 154/75  Pulse: 69  Temp: 98.1 F (36.7 C)  Resp: 16    General: ALERT AFEBRILe COMFORTABLE Cardiovascular: s1s2 Respiratory: ctab  Discharge  Instructions   Discharge Instructions    Diet Carb Modified    Complete by:  As directed      Discharge instructions    Complete by:  As directed   Follow up with PCP in one week.          Current Discharge Medication List    START taking these medications   Details  calcium acetate (PHOSLO) 667 MG capsule Take 2 capsules (1,334 mg total) by mouth 3 (three) times daily with meals. Qty: 90 capsule, Refills: 1    Nutritional Supplements (FEEDING SUPPLEMENT, NEPRO CARB STEADY,) LIQD Take 237 mLs by mouth as needed (missed meal during dialysis.). Refills: 0      CONTINUE these medications which have CHANGED   Details  gabapentin (NEURONTIN) 100 MG capsule Take 1 capsule (100 mg total) by mouth 3 (three) times daily. Qty: 90 capsule, Refills: 0    insulin aspart (NOVOLOG) 100 UNIT/ML injection CBG 70 - 120: 0 units CBG 121 - 150: 3 units CBG 151 - 200: 4 units CBG 201 - 250: 7 units CBG 251 - 300: 11 units CBG 301 - 350: 15 units CBG 351 - 400: 20 units Qty: 10 mL, Refills: 2      CONTINUE these medications which have NOT CHANGED   Details  amLODipine (NORVASC) 10 MG tablet Take 10 mg by mouth daily.    furosemide (LASIX) 20 MG tablet Take 20 mg by mouth  2 (two) times daily.    hydrALAZINE (APRESOLINE) 100 MG tablet Take 100 mg by mouth 2 (two) times daily.    insulin glargine (LANTUS) 100 UNIT/ML injection Inject 40 Units into the skin 2 (two) times daily.    simvastatin (ZOCOR) 40 MG tablet Take 40 mg by mouth every evening.       Allergies  Allergen Reactions  . Morphine And Related Nausea Only  . Sulfa Antibiotics Other (See Comments)    swelling      The results of significant diagnostics from this hospitalization (including imaging, microbiology, ancillary and laboratory) are listed below for reference.    Significant Diagnostic Studies: Dg Chest 2 View  12/12/2014   CLINICAL DATA:  Three-day history of shortness of breath and bilateral lower  extremity edema. Chronic renal failure.  EXAM: CHEST  2 VIEW  COMPARISON:  December 02, 2014  FINDINGS: The degree of inspiration is shallow. There is no edema or consolidation. Heart is upper normal in size with pulmonary vascularity within normal limits. No adenopathy. No bone lesions.  IMPRESSION: No edema or consolidation.   Electronically Signed   By: Bretta Bang III M.D.   On: 12/12/2014 09:08    Microbiology: Recent Results (from the past 240 hour(s))  MRSA PCR Screening     Status: None   Collection Time: 12/12/14  6:20 PM  Result Value Ref Range Status   MRSA by PCR NEGATIVE NEGATIVE Final    Comment:        The GeneXpert MRSA Assay (FDA approved for NASAL specimens only), is one component of a comprehensive MRSA colonization surveillance program. It is not intended to diagnose MRSA infection nor to guide or monitor treatment for MRSA infections.      Labs: Basic Metabolic Panel:  Recent Labs Lab 12/12/14 1011 12/12/14 1015 12/12/14 1154 12/13/14 0524 12/14/14 0648  NA 135 135 139 137 133*  K 7.7* >7.5* 5.2* 4.7 5.1  CL 104 101 103 97 94*  CO2  --  21  --  29 24  GLUCOSE 295* 291* 194* 269* 368*  BUN 86* 91* 58* 41* 36*  CREATININE 15.60* 16.48* 11.70* 10.45* 8.72*  CALCIUM  --  8.9  --  8.4 9.4  PHOS  --   --   --  7.3*  --    Liver Function Tests:  Recent Labs Lab 12/13/14 0524  ALBUMIN 3.4*   No results for input(s): LIPASE, AMYLASE in the last 168 hours. No results for input(s): AMMONIA in the last 168 hours. CBC:  Recent Labs Lab 12/12/14 1011 12/12/14 1015 12/12/14 1154 12/14/14 0648  WBC  --  7.5  --  6.5  HGB 12.9* 10.2* 11.9* 11.2*  HCT 38.0* 32.0* 35.0* 34.6*  MCV  --  86.0  --  85.4  PLT  --  194  --  183   Cardiac Enzymes: No results for input(s): CKTOTAL, CKMB, CKMBINDEX, TROPONINI in the last 168 hours. BNP: BNP (last 3 results)  Recent Labs  12/12/14 1015  BNP 687.2*    ProBNP (last 3 results) No results for  input(s): PROBNP in the last 8760 hours.  CBG:  Recent Labs Lab 12/12/14 2206 12/13/14 1302 12/13/14 1731 12/13/14 1938 12/14/14 0811  GLUCAP 215* 302* 295* 202* 406*       Signed:  Raja Caputi  [facility] 12/14/2014, 9:33 AM

## 2014-12-14 NOTE — Progress Notes (Signed)
Pt reported generalized pain at 10/10 with no relief from the two Vicodin tabs he received earlier. Page to on-call MD. One time dose for breakthrough oxycodone administered. Will continue to monitor. Dondra SpryMoore, Sayge Salvato Islee, RN

## 2014-12-14 NOTE — Progress Notes (Signed)
Pt CBG 406, pt to get 20 units novolog, paged Dr. Tally DueAkula FYI and any further?

## 2014-12-14 NOTE — Progress Notes (Signed)
Subjective: Interval History: has no complaint, feels better.  Objective: Vital signs in last 24 hours: Temp:  [97.8 F (36.6 C)-99.2 F (37.3 C)] 98.3 F (36.8 C) (03/17 1011) Pulse Rate:  [67-80] 70 (03/17 1011) Resp:  [13-20] 17 (03/17 1011) BP: (100-168)/(54-109) 140/63 mmHg (03/17 1011) SpO2:  [93 %-100 %] 93 % (03/17 1011) Weight:  [104.146 kg (229 lb 9.6 oz)-106.1 kg (233 lb 14.5 oz)] 104.146 kg (229 lb 9.6 oz) (03/17 0111) Weight change: -4.7 kg (-10 lb 5.8 oz)  Intake/Output from previous day: 03/16 0701 - 03/17 0700 In: 490 [P.O.:490] Out: 4514 [Urine:50] Intake/Output this shift: Total I/O In: 480 [P.O.:480] Out: 100 [Urine:100]  General appearance: alert, cooperative, no distress and moderately obese Resp: diminished breath sounds bilaterally Cardio: S1, S2 normal and systolic murmur: holosystolic 2/6, blowing at apex, LV lift GI: pos bs, liver down 6 cm Extremities: AVF LUA b&T  Lab Results:  Recent Labs  12/12/14 1015 12/12/14 1154 12/14/14 0648  WBC 7.5  --  6.5  HGB 10.2* 11.9* 11.2*  HCT 32.0* 35.0* 34.6*  PLT 194  --  183   BMET:  Recent Labs  12/13/14 0524 12/14/14 0648  NA 137 133*  K 4.7 5.1  CL 97 94*  CO2 29 24  GLUCOSE 269* 368*  BUN 41* 36*  CREATININE 10.45* 8.72*  CALCIUM 8.4 9.4   No results for input(s): PTH in the last 72 hours. Iron Studies: No results for input(s): IRON, TIBC, TRANSFERRIN, FERRITIN in the last 72 hours.  Studies/Results: No results found.  I have reviewed the patient's current medications.  Assessment/Plan: 1 ESRD vol much better, ok to send to outpatient HD 2 NONadherence primary issue 3 DM poor control 4 Obesity 5 HTN betterwith better vol P D/C per primary    LOS: 1 day   Lakeria Starkman L 12/14/2014,10:43 AM

## 2014-12-14 NOTE — Progress Notes (Signed)
Pt states cannot take vicodin she take percocet 10/650 at home.. Paged Dr.Sullivan fyi

## 2015-01-21 NOTE — Discharge Summary (Signed)
PATIENT NAME:  Troy Buchanan, Troy Buchanan MR#:  [id number] DATE OF BIRTH:  1971/01/07  DATE OF ADMISSION:  03/03/2012 DATE OF DISCHARGE:  03/05/2012  PRESENTING COMPLAINT:  Right foot ulcer and pain.   DISCHARGE DIAGNOSIS: 1. 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 Status post local debridement by podiatry Dr. Alberteen Spindleline.  2. Type 2 diabetes, uncontrolled.  3. Dietary noncompliance.  4. Hypertension.  5. History of cardiomyopathy.   CONDITION ON DISCHARGE: Fair.   MEDICATIONS:  1. Clonidine 0.2 mg b.i.d.  2. Simvastatin 20 mg 2 tablets at bedtime.  3. Labetalol 300 mg twice a day.  4. Amlodipine 10 mg p.o. daily.  5. Lasix 40 mg p.o. daily.  6. Hydralazine 100 mg 3 times a day.  7. Insulin glargine 75 units subcutaneous twice a day.  8. Insulin aspart 25 units subcutaneous t.i.d. with meals.  9. Norco 5/325 one every eight hours as needed.  10. Doxycycline 100 mg b.i.d. for 10 days.   FOLLOWUP: [place] [facility] in one week. [facility] in one week.   MRI of the foot shows persistent diffuse edema within the soft tissues of the plantar aspect of the right foot. These findings are present sequela of the myositis with possible fasciitis which appears chronic. No evidence of osteomyelitis. Findings are reflective of cellulitis.   LABORATORY:  White count 8.7, hemoglobin and hematocrit 10.2 and 31.2, platelet count 340. Glucose 233, BUN 18, creatinine 2.3, sodium 140, potassium 3.9, chloride 108, bicarbonate 24, calcium 8.3. A1c 9.9. Cholesterol 263, HDL 72, LDL 141.  Right foot culture,  moderate growth of unidentified organism. Blood cultures no growth in 36 hours. Urinalysis negative for urinary tract infection. Positive for proteinuria and glucosuria.   CONSULTATIONS: Podiatry Dr. Linus Galasodd Cline.   BRIEF SUMMARY OF HOSPITAL COURSE: Troy Buchanan. is a 44 year old African American gentleman well known to our service from previous admissions presents with:   1. Right foot cellulitis with infected diabetic foot ulcer on the right foot amputation stump. The patient was admitted on the medical floor, started on IV antibiotics with Zosyn and vancomycin given history of MRSA in the past. His wound cultures did  grow some moderate unidentified organism. The patient's antibiotics were changed to p.o. doxycycline which he had taken and responded in the past with it after blood cultures were noted to be negative. Dr. Alberteen Spindleline from podiatry did local debridement, wet-to-dry dressings were recommended along with follow-up [place] podiatry.  2. Diabetes type 2,  poorly controlled. The patient has significant history of dietary noncompliance. His Lantus was increased including his insulin aspart dose was increased as well. The patient was recommended lifestyle changes including dietary compliance.  3. Nonischemic cardiomyopathy. Home meds were continued.  4. Chronic renal failure creatinine remained stable.  Lasix was continued.  5. Accelerated hypertension, improved. The patient remained on clonidine, Norvasc, labetalol  and hydralazine.   Hospital stay otherwise remained stable.   CODE STATUS: THE PATIENT REMAINED A FULL CODE.   TIME SPENT: 40 minutes.  ____________________________ Wylie HailSona A. Allena KatzPatel, MD sap:ljs D: 03/06/2012 13:29:55 ET T: 03/08/2012 11:28:56 ET JOB#: [id number]  cc: Vyom Brass A. Allena KatzPatel, MD, <Dictator> Willow OraSONA A Mable Dara MD ELECTRONICALLY SIGNED 03/20/2012 15:40

## 2015-01-21 NOTE — H&P (Signed)
PATIENT NAME:  Troy Buchanan, Troy Buchanan MR#:  [id number] DATE OF BIRTH:  12/14/1970  DATE OF ADMISSION:  03/03/2012  ER REFERRING PHYSICIAN: Lowella Fairy, MD    PRIMARY CARE PHYSICIAN:  Primary care physician is in [facility].  REASON FOR ADMISSION: Drainage from the right foot as well as hyperglycemia.   HISTORY OF PRESENT ILLNESS: The patient is a 44 year old African American male with a history of diabetes, peripheral vascular disease, hypertension, who was actually hospitalized here in December with the same complaint. He was noted to have a right diabetic foot wound, cultures growing MRSA. He also was noted to have right lower extremity myositis, plantar fasciitis per MRI. He was seen by Podiatry at that time as well as Infectious Disease and was treated with initially IV antibiotics. Subsequently, he was changed to p.o. antibiotics. He also had superficial debridement per Podiatry. He reports that he has not been feeling well for the past two days. He is visiting his girlfriend here locally and noticed his blood sugars to be very high and was noted to have drainage similar to the one he is having currently from his right foot. He reports that he was last hospitalized in [facility] for the same problem. He does complain of low-grade temperatures. He denies any chills. He has chronic shortness of breath. He denies any chest pains or palpitations. He denies any abdominal pain. He reports that he did throw up yesterday. He denies any urinary frequency, urgency, or hesitancy. He denies any calf pain.   PAST MEDICAL HISTORY:  1. Diabetes.  2. Hypertension.  3. Morbid obesity.  4. He was noted to have cardiomyopathy with ejection fraction of 40% with negative Myoview during his last hospitalization in December 2012. He also was noted to have elevated troponin felt to be due from demand ischemia.  5. He has malignant hypertension.  6. Type 2 diabetes, on insulin.  7. Chronic renal failure with baseline  creatinine that varies significantly. During his last hospitalization it ranged from 1.35 to 2.15. On discharge, it was 1.35.  8. Anemia of chronic disease.  9. Obesity with possible sleep apnea.  10. History of deep vein thrombosis, according to his last discharge.   CURRENT MEDICATIONS:  1. Clonidine 0.1 t.i.d. This is per the patient's recollection. We are having the Pharmacy Tech call his Pharmacy to verify these. 2. Clonidine 0.1 mg t.i.d.  3. Hydralazine 100 mg t.i.d.  4. Labetalol 300 mg b.i.d.  5. Lantus 70 units b.i.d.  6. Lasix 40 mg daily.  7. Norvasc 10 mg daily.  8. He takes NovoLog 15 units before meals.   ALLERGIES: Morphine and sulfa.   PAST SURGICAL HISTORY:  1. Amputation of bilateral feet.  2. History of abscess removed from his buttocks.   FAMILY HISTORY: Mother is living with diabetes and hypertension. Father has diabetes, hypertension. A grandmother died of coronary artery disease and MI.   SOCIAL HISTORY: He is divorced with two children. He does not smoke, drink or use illicit drugs.    REVIEW OF SYSTEMS: CONSTITUTIONAL: He complains of low-grade fevers, complains of fatigue, weakness, pain in his right foot. No weight loss. No weight gain. EYES: No blurred vision. No  double vision. No pain. No redness. No inflammation. No glaucoma. No cataracts. ENT: No tinnitus. No ear pain. No hearing loss. No seasonal or year-round allergies. RESPIRATORY: No cough. No wheezing. No hemoptysis. No chronic obstructive pulmonary disease. No tuberculosis. CARDIOVASCULAR: No chest pain. No orthopnea. No edema. No syncope.  He does complain of some dyspnea on exertion. GASTROINTESTINAL: No nausea, vomiting, diarrhea. No abdominal pain. No hematemesis. No melena. GENITOURINARY: Denies any dysuria, hematuria, renal calculus, or frequency. ENDOCRINE: Denies any polydipsia, nocturia, or thyroid problems. HEME/LYMPH: Denies anemia, easy bruisability, or bleeding. SKIN: No acne.  No rash. No  changes in mole, hair or skin. MUSCULOSKELETAL: Denies any pain in the neck, back, or shoulder. NEUROLOGICAL: No numbness. No cerebrovascular accident. No transient ischemic attack. No seizures. PSYCHIATRIC: No anxiety. No insomnia. No ADD.  PHYSICAL EXAMINATION:  VITAL SIGNS: Temperature 97.5, pulse 80, respirations 16, blood pressure 210/109.   GENERAL: The patient is an obese [place] American male, appears chronically ill.   HEENT: Head atraumatic, normocephalic. Pupils are equally round, reactive to light and accommodation. Extraocular movements are intact. There is no conjunctival pallor. No scleral icterus. Extraocular movements are intact. Nasal exam shows no drainage or ulceration. Oropharynx is clear without any exudates.   NECK: No thyromegaly. No carotid bruits.   CARDIOVASCULAR: Regular rate and rhythm. No murmurs, rubs, clicks, or gallops. PMI is not displaced.   LUNGS: Clear to auscultation bilaterally without any rales, rhonchi, or wheezing.   ABDOMEN: Soft, nontender, nondistended. Positive bowel sounds x4.   EXTREMITIES: He has no clubbing, cyanosis, edema.   SKIN: No rash, lesions, except in his right foot he has an opened wound, and there is some bloody drainage from there with surrounding fluctuance.   LYMPH NODES: No lymph nodes palpable.   NEUROLOGICAL: Cranial nerves II through XII are grossly intact. Strength five out of five, 2+ reflexes.   PSYCHIATRIC: Alert, oriented, not anxious or depressed.   LABORATORY, DIAGNOSTIC AND RADIOLOGICAL DATA:  In the Emergency Department , his blood glucose was 591, BUN 16, creatinine 2.16, sodium 138, potassium 3.8, chloride 108, CO2 19. His anion gap is 11.  LFTs: Total protein 6.2, albumin 1.6, bilirubin total 0.4, alkaline phosphatase 179, AST 27, ALT 32.  WBC count 7.5, hemoglobin 12.5, platelet count 347.0.  X-ray of the right foot shows no radiographic findings to suggest osteomyelitis.   ASSESSMENT AND PLAN: The  patient is a 44 year old with chronic right foot wound who presents with drainage, hyperglycemia.   1. Possible right foot cellulitis, possible infected ulcer: At this time we will go ahead and treat him with IV antibiotics with Zosyn and vancomycin. We will obtain an MRI. We will also ask Podiatry to come see if  there is any further debridement that needs to be done.  2. Diabetes, very poorly controlled: I will increase his Lantus dose. I will continue his premeal insulin and place him on sliding scale insulin. I will check a hemoglobin A1c.  3. History of nonischemic cardiomyopathy: In light of his creatinine being elevated at this point, I will just hold his Lasix and follow his ins and outs and his respiratory status.  4. Acute renal failure on chronic renal failure: I will hold Lasix. The patient has already received IV fluid bolus per the ED physician, so I will not give him any further fluids. I will follow his renal function in the morning.  5. Accelerated hypertension: He is currently on labetalol which was recently increased. We will continue that, continue Norvasc. I will increase his clonidine dose. We will continue hydralazine. If needed, we can increase his clonidine to 0.3 t.i.d. His labetalol dose also could be increased.  6. Miscellaneous: We will place him on heparin. If  there is a significant amount of bleeding from the foot ulcer,  then we will stop the heparin.    TIME SPENT: 40 minutes spent.   ____________________________ Lacie ScottsShreyang H. Allena KatzPatel, MD shp:cbb D: 03/03/2012 17:42:45 ET T: 03/03/2012 18:52:28 ET JOB#: [id number]  cc: Katrin Grabel H. Allena KatzPatel, MD, <Dictator> Charise CarwinSHREYANG H Maygan Koeller MD ELECTRONICALLY SIGNED 03/08/2012 8:04

## 2015-01-21 NOTE — Consult Note (Signed)
PATIENT NAME:  Troy Buchanan, Troy Buchanan MR#:  [id number] DATE OF BIRTH:  1971-04-14  DATE OF CONSULTATION:  09/09/2011  REFERRING PHYSICIAN:   CONSULTING PHYSICIAN:  Matsuko Kretz A. Ether GriffinsFowler, DPM  REASON FOR CONSULTATION: Right foot transmetatarsal amputation nonhealing.   HISTORY OF PRESENT ILLNESS: This is a 44 year old male who has a history of a recent transmetatarsal amputation for an infection in his right foot in July of this year. He states he had an area of nonhealing, went back for a skin graft that was removed from his hip and placed on the wound flap in October. This had an area of slow healing. Recently [place] and developed symptoms of nausea and vomiting.  He was seen in the Emergency Room and admitted with a possible diabetic foot infection and possible congestive heart failure.   PAST MEDICAL HISTORY: Diabetes, hypertension, and obesity.   HOME MEDICATIONS:  See the home medication list from the initial History and Physical that was performed.   ALLERGIES: Morphine and sulfa.  HOSPITALIZATIONS:  Hospitalization for bilateral transmetatarsal amputations.   SOCIAL HISTORY: He is divorced. Two healthy children. He does not smoke. He is on disability.  He was previously in the [employer].   REVIEW OF SYSTEMS: He denies fever but did complain of nausea and vomiting. No overt shortness of breath or chest pains at this time. His pain is markedly stabilized to the lower extremities.   PHYSICAL EXAMINATION:  GENERAL: He is alert and oriented.   VASCULAR: He has strongly palpable dorsalis pedis and posterior tibial pulses to the lower extremities.   NEUROLOGIC: He is completely neuropathic to the lower extremities.   DERMATOLOGIC: On his right foot he has a transmetatarsal amputation. The plantar flap had some dried hyperkeratotic tissue that I was able to remove and debris away today. There are some areas of superficial breakdown of this flap. There is no deep  breakdown at all. There is no deep penetrating ulcerative site. There was a little bit of superficial infection that upon removal of all of the callused and nonviable tissue, the skin itself looked very healthy. There is no overt dehiscence of the amputation site at this time. Otherwise, the heel is intact with no ulcerations to this area.   MUSCULOSKELETAL: Status post transmetatarsal amputations bilaterally.   ASSESSMENT: Recent transmetatarsal amputation with skin grafting with mild superficial skin sloughing.   PLAN: I was able to remove all of the nonviable tissue to the right foot today. There is an open ulceration on the plantar flap at the distal amputation site, but it is stable with granular tissue base. No signs of any deep infection. He had a recent culture with an MRSA infection. Most likely this is a superficial skin infection that has no depth to it, and I will treat this as such. His MRI was negative for osteomyelitis. Therefore, I think oral antibiotics and close local wound care to this site are warranted. He seems to be fairly stable at this time. Continue with daily dressing changes to his foot. I will see him as needed, as [facility] [place] [facility], with a Dr. Meredeth IdeFleming who performed the surgeries to his feet.     ____________________________ Argentina DonovanJustin A. Ether GriffinsFowler, DPM jaf:bjt D: 09/09/2011 13:46:29 ET T: 09/09/2011 14:03:34 ET JOB#: [id number]  cc: Jill AlexandersJustin A. Ether GriffinsFowler, DPM, <Dictator> Eulia Hatcher DPM ELECTRONICALLY SIGNED 10/30/2011 15:07

## 2015-01-21 NOTE — Consult Note (Signed)
PATIENT NAME:  Lequita AsalSCIPIO, Joneric MR#:  [id number] DATE OF BIRTH:  1971-03-23  DATE OF CONSULTATION:  03/04/2012  REFERRING PHYSICIAN:   CONSULTING PHYSICIAN:  Linus Galasodd Delaila Nand, DPM  REASON FOR CONSULTATION: This is a 44 year old male with a history of diabetes and peripheral vascular disease who recently has had a one-week history of some fever as well as pain in his right foot. Previously has had a transmetatarsal amputation. Was hospitalized back in December for a similar complaint and had superficial debridement. Cultures did grow out MRSA. Was initially treated with IV antibiotics, switched over to p.o. and apparently did fine with that.   PAST MEDICAL HISTORY:  1. Diabetes.  2. Hypertension.  3. Morbid obesity.  4. Cardiomyopathy with ejection fraction 40% with negative Myoview. 5. Malignant hypertension. 6. Chronic renal failure.  7. Anemia of chronic disease.  8. Possible sleep apnea.  9. History of deep vein thrombosis.   PAST SURGICAL HISTORY:  1. Bilateral transmetatarsal amputation.  2. Incision and drainage abscess buttocks.   MEDICATIONS:  1. Clonidine 0.1 t.i.d.  2. Hydralazine 100 mg t.i.d.  3. Labetalol 300 mg b.i.d.  4. Lantus 70 units b.i.d.  5. Lasix 40 mg daily. 6. Norvasc 10 mg daily. 7. NovoLog 15 units before meals.   ALLERGIES: Morphine and sulfa.   FAMILY HISTORY: Diabetes, hypertension, heart disease.   SOCIAL HISTORY: He is divorced. Denies alcohol or tobacco use.   REVIEW OF SYSTEMS: One week history of low-grade fevers. Some generalized weakness and fatigue. Has recently had some pain in his right foot. Does not complain of any numbness or paresthesias. Has had a little swelling in the right foot. Denies any stomach pain, heartburn, vomiting, or diarrhea. No cough or shortness of breath. Denies dysuria. No hearing or vision changes.   PHYSICAL EXAMINATION:  VASCULAR: DP and PT pulses are palpable but diminished. No capillary refill from previous amputations.    NEUROLOGICAL: Appears to be some loss of protective threshold distally at the stump of his amputation. Intact more proximal.   INTEGUMENT: Skin is warm, dry, and supple. Previous amputation with skin graft at the distal right foot. There is a small amount of drainage with a granular ulceration approximately 1.5 cm x 1 cm. No deep extension. No abscess or fluid collection could be expressed.   MUSCULOSKELETAL: Previous amputation of the forefoot bilateral. Stiff range of motion at the ankle joint.   LABORATORY, DIAGNOSTIC AND RADIOLOGICAL DATA: X-rays, multiple views revealed surgical changes from the transmetatarsal amputation. There are some spur formations present off the distal metatarsals. No clear evidence of any bony erosions are noted.   MRI was reviewed with radiology and did not clearly show any evidence of marrow edema or fluid collections in the forefoot. No clear evidence of abscess or osteomyelitis.   IMPRESSION:  1. Pain with superficial ulceration, right forefoot.  2. Diabetes.  3. Peripheral vascular disease.   PLAN: I debrided the ulceration excisionally using tissue nippers on the right foot. Wet-to-dry dressing was applied. At this point I think the patient will be fine with local wound care and most likely oral antibiotics on discharge. Patient will be followed up outpatient as needed.   ____________________________ Linus Galasodd Marguerita Stapp, DPM tc:cms D: 03/04/2012 16:45:19 ET T: 03/04/2012 17:03:09 ET JOB#: [id number]  cc: Linus Galasodd Reef Achterberg, DPM, <Dictator>  Lizania Bouchard DPM ELECTRONICALLY SIGNED 03/12/2012 9:28

## 2015-10-31 DEATH — deceased
